# Patient Record
Sex: Female | Born: 1955
Health system: Southern US, Community
[De-identification: ages and names within clinical notes are randomized; demographics above are authoritative.]

## PROBLEM LIST (undated history)

## (undated) DIAGNOSIS — J439 Emphysema, unspecified: Secondary | ICD-10-CM

## (undated) DIAGNOSIS — F329 Major depressive disorder, single episode, unspecified: Secondary | ICD-10-CM

## (undated) DIAGNOSIS — M1990 Unspecified osteoarthritis, unspecified site: Secondary | ICD-10-CM

## (undated) DIAGNOSIS — F32A Depression, unspecified: Secondary | ICD-10-CM

## (undated) HISTORY — DX: Major depressive disorder, single episode, unspecified: F32.9

## (undated) HISTORY — DX: Depression, unspecified: F32.A

---

## 2004-08-28 ENCOUNTER — Ambulatory Visit: Payer: Self-pay | Admitting: Gynecology

## 2006-01-26 ENCOUNTER — Emergency Department: Payer: Self-pay | Admitting: Emergency Medicine

## 2006-01-26 ENCOUNTER — Other Ambulatory Visit: Payer: Self-pay

## 2006-01-28 ENCOUNTER — Ambulatory Visit: Payer: Self-pay | Admitting: Emergency Medicine

## 2007-03-06 ENCOUNTER — Ambulatory Visit: Payer: Self-pay

## 2008-12-06 ENCOUNTER — Ambulatory Visit: Payer: Self-pay | Admitting: Family Medicine

## 2008-12-08 ENCOUNTER — Ambulatory Visit: Payer: Self-pay | Admitting: Family Medicine

## 2008-12-22 ENCOUNTER — Ambulatory Visit: Payer: Self-pay | Admitting: Family Medicine

## 2008-12-31 ENCOUNTER — Ambulatory Visit: Payer: Self-pay | Admitting: Family Medicine

## 2009-02-23 ENCOUNTER — Ambulatory Visit: Payer: Self-pay | Admitting: Specialist

## 2009-07-27 ENCOUNTER — Ambulatory Visit: Payer: Self-pay | Admitting: Specialist

## 2009-08-17 ENCOUNTER — Ambulatory Visit: Payer: Self-pay | Admitting: Family Medicine

## 2009-12-08 ENCOUNTER — Ambulatory Visit: Payer: Self-pay | Admitting: Family Medicine

## 2010-11-06 ENCOUNTER — Emergency Department: Payer: Self-pay | Admitting: Internal Medicine

## 2010-11-08 ENCOUNTER — Ambulatory Visit: Payer: Self-pay | Admitting: Urology

## 2011-06-01 ENCOUNTER — Ambulatory Visit: Payer: Self-pay | Admitting: Family Medicine

## 2014-08-06 ENCOUNTER — Ambulatory Visit: Payer: Self-pay

## 2015-01-24 ENCOUNTER — Ambulatory Visit (INDEPENDENT_AMBULATORY_CARE_PROVIDER_SITE_OTHER): Payer: BLUE CROSS/BLUE SHIELD | Admitting: Family Medicine

## 2015-01-24 ENCOUNTER — Encounter: Payer: Self-pay | Admitting: Family Medicine

## 2015-01-24 VITALS — BP 120/80 | HR 80 | Ht 64.0 in | Wt 117.0 lb

## 2015-01-24 DIAGNOSIS — W57XXXA Bitten or stung by nonvenomous insect and other nonvenomous arthropods, initial encounter: Principal | ICD-10-CM

## 2015-01-24 DIAGNOSIS — L089 Local infection of the skin and subcutaneous tissue, unspecified: Secondary | ICD-10-CM

## 2015-01-24 DIAGNOSIS — S60469A Insect bite (nonvenomous) of unspecified finger, initial encounter: Secondary | ICD-10-CM

## 2015-01-24 MED ORDER — SULFAMETHOXAZOLE-TRIMETHOPRIM 400-80 MG PO TABS: 1.0000 | ORAL_TABLET | Freq: Two times a day (BID) | ORAL | Status: DC

## 2015-01-24 MED ORDER — DESOXIMETASONE 0.25 % EX CREA: 1.0000 "application " | TOPICAL_CREAM | Freq: Two times a day (BID) | CUTANEOUS | Status: DC

## 2015-01-24 MED ORDER — PREDNISONE 10 MG PO TABS: ORAL_TABLET | ORAL | Status: DC

## 2015-01-24 NOTE — Progress Notes (Signed)
Name: Rogelio Winbush   MRN: [id number]    DOB: [birth year]   Date:01/24/2015       Progress Note  Subjective  Chief Complaint  Chief Complaint  Patient presents with  . Animal Bite    bee sting- yellow jacket    Animal Bite  The incident occurred yesterday. The incident occurred at another residence. There is an injury to the right long finger. The pain is moderate. It is unlikely that a foreign body is present. Pertinent negatives include no chest pain, no fussiness, no numbness, no visual disturbance, no abdominal pain, no nausea, no bladder incontinence, no headaches, no hearing loss, no neck pain, no focal weakness, no tingling, no cough and no difficulty breathing. She is right-handed.    No problem-specific assessment & plan notes found for this encounter.   Past Medical History  Diagnosis Date  . Depression     History reviewed. No pertinent past surgical history.  No family history on file.  History   Social History  . Marital Status: Married    Spouse Name: N/A  . Number of Children: N/A  . Years of Education: N/A   Occupational History  . Not on file.   Social History Main Topics  . Smoking status: Current Every Day Smoker  . Smokeless tobacco: Not on file  . Alcohol Use: No  . Drug Use: No  . Sexual Activity: No   Other Topics Concern  . Not on file   Social History Narrative  . No narrative on file    Allergies  Allergen Reactions  . Penicillins      Review of Systems  Constitutional: Negative for fever, chills, weight loss and malaise/fatigue.  HENT: Negative for ear discharge, ear pain, hearing loss and sore throat.   Eyes: Negative for blurred vision and visual disturbance.  Respiratory: Negative for cough, sputum production, shortness of breath and wheezing.   Cardiovascular: Negative for chest pain, palpitations and leg swelling.  Gastrointestinal: Negative for heartburn, nausea, abdominal pain, diarrhea, constipation, blood in stool  and melena.  Genitourinary: Negative for bladder incontinence, dysuria, urgency, frequency and hematuria.  Musculoskeletal: Negative for myalgias, back pain, joint pain and neck pain.  Skin: Positive for itching and rash.  Neurological: Negative for dizziness, tingling, sensory change, focal weakness, numbness and headaches.  Endo/Heme/Allergies: Negative for environmental allergies and polydipsia. Does not bruise/bleed easily.  Psychiatric/Behavioral: Negative for depression and suicidal ideas. The patient is not nervous/anxious and does not have insomnia.      Objective  Filed Vitals:   01/24/15 1548  BP: 120/80  Pulse: 80  Height: 5\' 4"  (1.626 m)  Weight: 117 lb (53.071 kg)    Physical Exam  Constitutional: She is well-developed, well-nourished, and in no distress. No distress.  HENT:  Head: Normocephalic and atraumatic.  Right Ear: External ear normal.  Left Ear: External ear normal.  Nose: Nose normal.  Mouth/Throat: Oropharynx is clear and moist.  Eyes: Conjunctivae and EOM are normal. Pupils are equal, round, and reactive to light. Right eye exhibits no discharge. Left eye exhibits no discharge.  Neck: Normal range of motion. Neck supple. No JVD present. No thyromegaly present.  Cardiovascular: Normal rate, regular rhythm, normal heart sounds and intact distal pulses.  Exam reveals no gallop and no friction rub.   No murmur heard. Pulmonary/Chest: Effort normal and breath sounds normal. No respiratory distress. She has no wheezes. She has no rales. She exhibits no tenderness.  Abdominal: Soft. Bowel sounds are normal.  She exhibits no mass. There is no tenderness. There is no guarding.  Musculoskeletal: Normal range of motion. She exhibits no edema or tenderness.  Lymphadenopathy:    She has no cervical adenopathy.  Neurological: She is alert. She has normal reflexes.  Skin: Skin is warm and dry. Rash noted. She is not diaphoretic. There is erythema. No pallor.   Psychiatric: Mood and affect normal.      Assessment & Plan  Problem List Items Addressed This Visit    None    Visit Diagnoses    Insect bite finger-infected, initial encounter    -  Primary    Relevant Medications    sulfamethoxazole-trimethoprim (BACTRIM) 400-80 MG per tablet    predniSONE (DELTASONE) 10 MG tablet         Dr. Deanna Jones [facility]  01/24/2015

## 2015-10-11 ENCOUNTER — Encounter: Payer: Self-pay | Admitting: Family Medicine

## 2015-10-11 ENCOUNTER — Ambulatory Visit (INDEPENDENT_AMBULATORY_CARE_PROVIDER_SITE_OTHER): Payer: BLUE CROSS/BLUE SHIELD | Admitting: Family Medicine

## 2015-10-11 VITALS — BP 120/80 | HR 78 | Ht 64.0 in | Wt 112.0 lb

## 2015-10-11 DIAGNOSIS — J0100 Acute maxillary sinusitis, unspecified: Secondary | ICD-10-CM | POA: Diagnosis not present

## 2015-10-11 MED ORDER — AZITHROMYCIN 250 MG PO TABS: ORAL_TABLET | ORAL | Status: DC

## 2015-10-11 MED ORDER — BENZONATATE 100 MG PO CAPS: 100.0000 mg | ORAL_CAPSULE | Freq: Two times a day (BID) | ORAL | Status: DC | PRN

## 2015-10-11 NOTE — Progress Notes (Signed)
Name: Kathryn Willis   MRN: [id number]    DOB: 08/06/1955   Date:10/11/2015       Progress Note  Subjective  Chief Complaint  Chief Complaint  Patient presents with  . Sinusitis    cough and cong, sore throat    Sinusitis This is a new problem. The current episode started in the past 7 days. The problem has been gradually worsening since onset. There has been no fever. Associated symptoms include coughing, diaphoresis, headaches, sinus pressure and a sore throat. Pertinent negatives include no chills, congestion, ear pain, hoarse voice, neck pain or shortness of breath. Past treatments include acetaminophen. The treatment provided no relief.  Cough This is a new problem. The current episode started in the past 7 days. The cough is non-productive. Associated symptoms include headaches and a sore throat. Pertinent negatives include no chest pain, chills, ear pain, fever, heartburn, myalgias, rash, shortness of breath, weight loss or wheezing. There is no history of environmental allergies.    No problem-specific assessment & plan notes found for this encounter.   Past Medical History  Diagnosis Date  . Depression     No past surgical history on file.  No family history on file.  Social History   Social History  . Marital Status: Married    Spouse Name: N/A  . Number of Children: N/A  . Years of Education: N/A   Occupational History  . Not on file.   Social History Main Topics  . Smoking status: Current Every Day Smoker  . Smokeless tobacco: Not on file  . Alcohol Use: No  . Drug Use: No  . Sexual Activity: No   Other Topics Concern  . Not on file   Social History Narrative    Allergies  Allergen Reactions  . Penicillins      Review of Systems  Constitutional: Positive for diaphoresis. Negative for fever, chills, weight loss and malaise/fatigue.  HENT: Positive for sinus pressure and sore throat. Negative for congestion, ear discharge, ear pain and hoarse  voice.   Eyes: Negative for blurred vision.  Respiratory: Positive for cough. Negative for sputum production, shortness of breath and wheezing.   Cardiovascular: Negative for chest pain, palpitations and leg swelling.  Gastrointestinal: Negative for heartburn, nausea, abdominal pain, diarrhea, constipation, blood in stool and melena.  Genitourinary: Negative for dysuria, urgency, frequency and hematuria.  Musculoskeletal: Negative for myalgias, back pain, joint pain and neck pain.  Skin: Negative for rash.  Neurological: Positive for headaches. Negative for dizziness, tingling, sensory change and focal weakness.  Endo/Heme/Allergies: Negative for environmental allergies and polydipsia. Does not bruise/bleed easily.  Psychiatric/Behavioral: Negative for depression and suicidal ideas. The patient is not nervous/anxious and does not have insomnia.      Objective  Filed Vitals:   10/11/15 1403  BP: 120/80  Pulse: 78  Height: 5\' 4"  (1.626 m)  Weight: 112 lb (50.803 kg)    Physical Exam  Constitutional: She is well-developed, well-nourished, and in no distress. No distress.  HENT:  Head: Normocephalic and atraumatic.  Right Ear: External ear normal.  Left Ear: External ear normal.  Nose: Nose normal.  Mouth/Throat: Oropharynx is clear and moist.  Eyes: Conjunctivae and EOM are normal. Pupils are equal, round, and reactive to light. Right eye exhibits no discharge. Left eye exhibits no discharge.  Neck: Normal range of motion. Neck supple. No JVD present. No thyromegaly present.  Cardiovascular: Normal rate, regular rhythm, normal heart sounds and intact distal pulses.  Exam  reveals no gallop and no friction rub.   No murmur heard. Pulmonary/Chest: Effort normal and breath sounds normal.  Abdominal: Soft. Bowel sounds are normal. She exhibits no mass. There is no tenderness. There is no guarding.  Musculoskeletal: Normal range of motion. She exhibits no edema.  Lymphadenopathy:     She has no cervical adenopathy.  Neurological: She is alert. She has normal reflexes.  Skin: Skin is warm and dry. She is not diaphoretic.  Psychiatric: Mood and affect normal.  Nursing note and vitals reviewed.     Assessment & Plan  Problem List Items Addressed This Visit    None    Visit Diagnoses    Acute maxillary sinusitis, recurrence not specified    -  Primary    Relevant Medications    azithromycin (ZITHROMAX) 250 MG tablet    benzonatate (TESSALON) 100 MG capsule         Dr. Christien Berthelot [facility]  10/11/2015

## 2015-12-27 DIAGNOSIS — Z01419 Encounter for gynecological examination (general) (routine) without abnormal findings: Secondary | ICD-10-CM | POA: Diagnosis not present

## 2015-12-27 DIAGNOSIS — Z124 Encounter for screening for malignant neoplasm of cervix: Secondary | ICD-10-CM | POA: Diagnosis not present

## 2015-12-27 DIAGNOSIS — Z1231 Encounter for screening mammogram for malignant neoplasm of breast: Secondary | ICD-10-CM | POA: Diagnosis not present

## 2016-05-10 DIAGNOSIS — Z23 Encounter for immunization: Secondary | ICD-10-CM | POA: Diagnosis not present

## 2016-08-09 ENCOUNTER — Ambulatory Visit (INDEPENDENT_AMBULATORY_CARE_PROVIDER_SITE_OTHER): Payer: BLUE CROSS/BLUE SHIELD | Admitting: Family Medicine

## 2016-08-09 ENCOUNTER — Encounter: Payer: Self-pay | Admitting: Family Medicine

## 2016-08-09 VITALS — BP 108/68 | HR 92 | Temp 97.8°F | Ht 64.0 in | Wt 120.0 lb

## 2016-08-09 DIAGNOSIS — J0100 Acute maxillary sinusitis, unspecified: Secondary | ICD-10-CM

## 2016-08-09 MED ORDER — LEVOFLOXACIN 500 MG PO TABS: 500.0000 mg | ORAL_TABLET | Freq: Every day | ORAL | 0 refills | Status: DC

## 2016-08-09 NOTE — Progress Notes (Signed)
Name: Kathryn Willis   MRN: [id number]    DOB: [birth year]   Date:08/09/2016       Progress Note  Subjective  Chief Complaint  Chief Complaint  Patient presents with  . Cough    Pt stated coughing for 1 month.    Cough  This is a new problem. The current episode started in the past 7 days. The problem has been waxing and waning. The problem occurs hourly. The cough is non-productive. Associated symptoms include chest pain, headaches, nasal congestion and a sore throat. Pertinent negatives include no chills, ear congestion, ear pain, fever, heartburn, hemoptysis, myalgias, postnasal drip, rash, shortness of breath, weight loss or wheezing. The symptoms are aggravated by cold air. She has tried nothing for the symptoms. The treatment provided mild relief. Her past medical history is significant for COPD. There is no history of environmental allergies or pneumonia.    No problem-specific Assessment & Plan notes found for this encounter.   Past Medical History:  Diagnosis Date  . Depression     No past surgical history on file.  No family history on file.  Social History   Social History  . Marital status: Married    Spouse name: N/A  . Number of children: N/A  . Years of education: N/A   Occupational History  . Not on file.   Social History Main Topics  . Smoking status: Current Every Day Smoker  . Smokeless tobacco: Not on file  . Alcohol use No  . Drug use: No  . Sexual activity: No   Other Topics Concern  . Not on file   Social History Narrative  . No narrative on file    Allergies  Allergen Reactions  . Penicillins      Review of Systems  Constitutional: Negative for chills, fever, malaise/fatigue and weight loss.  HENT: Positive for sore throat. Negative for ear discharge, ear pain and postnasal drip.   Eyes: Negative for blurred vision.  Respiratory: Positive for cough. Negative for hemoptysis, sputum production, shortness of breath and wheezing.    Cardiovascular: Positive for chest pain. Negative for palpitations and leg swelling.  Gastrointestinal: Negative for abdominal pain, blood in stool, constipation, diarrhea, heartburn, melena and nausea.  Genitourinary: Negative for dysuria, frequency, hematuria and urgency.  Musculoskeletal: Negative for back pain, joint pain, myalgias and neck pain.  Skin: Negative for rash.  Neurological: Positive for headaches. Negative for dizziness, tingling, sensory change and focal weakness.  Endo/Heme/Allergies: Negative for environmental allergies and polydipsia. Does not bruise/bleed easily.  Psychiatric/Behavioral: Negative for depression and suicidal ideas. The patient is not nervous/anxious and does not have insomnia.      Objective  Vitals:   08/09/16 1526  BP: 108/68  Pulse: 92  Temp: 97.8 F (36.6 C)  SpO2: 96%  Weight: 120 lb (54.4 kg)  Height: 5\' 4"  (1.626 m)    Physical Exam  Constitutional: She is well-developed, well-nourished, and in no distress. No distress.  HENT:  Head: Normocephalic and atraumatic.  Right Ear: External ear normal.  Left Ear: External ear normal.  Nose: Nose normal.  Mouth/Throat: Oropharynx is clear and moist.  Eyes: Conjunctivae and EOM are normal. Pupils are equal, round, and reactive to light. Right eye exhibits no discharge. Left eye exhibits no discharge.  Neck: Normal range of motion. Neck supple. No JVD present. No thyromegaly present.  Cardiovascular: Normal rate, regular rhythm, normal heart sounds and intact distal pulses.  Exam reveals no gallop and no friction rub.  No murmur heard. Pulmonary/Chest: Effort normal and breath sounds normal. She has no wheezes. She has no rales.  Abdominal: Soft. Bowel sounds are normal. She exhibits no mass. There is no tenderness. There is no guarding.  Musculoskeletal: Normal range of motion. She exhibits no edema.  Lymphadenopathy:    She has no cervical adenopathy.  Neurological: She is alert. She  has normal reflexes.  Skin: Skin is warm and dry. She is not diaphoretic.  Psychiatric: Mood and affect normal.  Nursing note and vitals reviewed.     Assessment & Plan  Problem List Items Addressed This Visit    None    Visit Diagnoses    Acute maxillary sinusitis, recurrence not specified    -  Primary   Relevant Medications   levofloxacin (LEVAQUIN) 500 MG tablet        Dr. Macon [facility]  08/09/16

## 2016-09-06 DIAGNOSIS — Z1231 Encounter for screening mammogram for malignant neoplasm of breast: Secondary | ICD-10-CM | POA: Diagnosis not present

## 2016-12-27 DIAGNOSIS — Z01419 Encounter for gynecological examination (general) (routine) without abnormal findings: Secondary | ICD-10-CM | POA: Diagnosis not present

## 2017-05-17 DIAGNOSIS — Z23 Encounter for immunization: Secondary | ICD-10-CM | POA: Diagnosis not present

## 2017-07-26 ENCOUNTER — Encounter: Payer: Self-pay | Admitting: Family Medicine

## 2017-07-26 ENCOUNTER — Ambulatory Visit (INDEPENDENT_AMBULATORY_CARE_PROVIDER_SITE_OTHER): Payer: BLUE CROSS/BLUE SHIELD | Admitting: Family Medicine

## 2017-07-26 ENCOUNTER — Ambulatory Visit
Admission: RE | Admit: 2017-07-26 | Discharge: 2017-07-26 | Disposition: A | Discharge status: Home / Self Care | Payer: BLUE CROSS/BLUE SHIELD | Source: Ambulatory Visit | Attending: Family Medicine | Admitting: Family Medicine

## 2017-07-26 VITALS — BP 100/60 | HR 84 | Temp 100.0°F | Ht 64.0 in | Wt 128.0 lb

## 2017-07-26 DIAGNOSIS — R059 Cough, unspecified: Secondary | ICD-10-CM

## 2017-07-26 DIAGNOSIS — R05 Cough: Secondary | ICD-10-CM | POA: Insufficient documentation

## 2017-07-26 DIAGNOSIS — R509 Fever, unspecified: Secondary | ICD-10-CM | POA: Insufficient documentation

## 2017-07-26 LAB — POCT INFLUENZA A/B
INFLUENZA A, POC: NEGATIVE
Influenza B, POC: NEGATIVE

## 2017-07-26 MED ORDER — GUAIFENESIN-CODEINE 100-10 MG/5ML PO SYRP: 5.0000 mL | ORAL_SOLUTION | Freq: Three times a day (TID) | ORAL | 0 refills | Status: DC | PRN

## 2017-07-26 MED ORDER — LEVOFLOXACIN 500 MG PO TABS: 500.0000 mg | ORAL_TABLET | Freq: Every day | ORAL | 0 refills | Status: DC

## 2017-07-26 NOTE — Progress Notes (Signed)
Name: Kathryn Willis   MRN: [id number]    DOB: 1956-06-22   Date:07/26/2017       Progress Note  Subjective  Chief Complaint  Chief Complaint  Patient presents with  . Sinusitis    cough and cong- no production, body aches, sweating, chills, no fever    Sinusitis  This is a new problem. The current episode started in the past 7 days. The problem has been gradually worsening since onset. The maximum temperature recorded prior to her arrival was 100.4 - 100.9 F. The fever has been present for 1 to 2 days. Associated symptoms include chills, congestion, coughing, diaphoresis, ear pain, headaches, a hoarse voice, shortness of breath and a sore throat. Pertinent negatives include no neck pain or sinus pressure. Past treatments include acetaminophen. The treatment provided no relief.  Cough  This is a new problem. The current episode started in the past 7 days. The problem has been waxing and waning. The cough is non-productive. Associated symptoms include chills, ear pain, headaches, a sore throat and shortness of breath. Pertinent negatives include no chest pain, fever, heartburn, hemoptysis, myalgias, rash, rhinorrhea, weight loss or wheezing. There is no history of environmental allergies.    No problem-specific Assessment & Plan notes found for this encounter.   Past Medical History:  Diagnosis Date  . Depression     History reviewed. No pertinent surgical history.  History reviewed. No pertinent family history.  Social History   Socioeconomic History  . Marital status: Married    Spouse name: Not on file  . Number of children: Not on file  . Years of education: Not on file  . Highest education level: Not on file  Social Needs  . Financial resource strain: Not on file  . Food insecurity - worry: Not on file  . Food insecurity - inability: Not on file  . Transportation needs - medical: Not on file  . Transportation needs - non-medical: Not on file  Occupational History   . Not on file  Tobacco Use  . Smoking status: Current Every Day Smoker  . Smokeless tobacco: Never Used  Substance and Sexual Activity  . Alcohol use: No    Alcohol/week: 0.0 oz  . Drug use: No  . Sexual activity: No  Other Topics Concern  . Not on file  Social History Narrative  . Not on file    Allergies  Allergen Reactions  . Penicillins     Outpatient Medications Prior to Visit  Medication Sig Dispense Refill  . sertraline (ZOLOFT) 50 MG tablet Take 1.5 tablets by mouth daily.  3  . levofloxacin (LEVAQUIN) 500 MG tablet Take 1 tablet (500 mg total) by mouth daily. 7 tablet 0   No facility-administered medications prior to visit.     Review of Systems  Constitutional: Positive for chills and diaphoresis. Negative for fever, malaise/fatigue and weight loss.  HENT: Positive for congestion, ear pain, hoarse voice and sore throat. Negative for ear discharge, rhinorrhea and sinus pressure.   Eyes: Negative for blurred vision.  Respiratory: Positive for cough and shortness of breath. Negative for hemoptysis, sputum production and wheezing.   Cardiovascular: Negative for chest pain, palpitations and leg swelling.  Gastrointestinal: Negative for abdominal pain, blood in stool, constipation, diarrhea, heartburn, melena and nausea.  Genitourinary: Negative for dysuria, frequency, hematuria and urgency.  Musculoskeletal: Negative for back pain, joint pain, myalgias and neck pain.  Skin: Negative for rash.  Neurological: Positive for headaches. Negative for dizziness, tingling, sensory  change and focal weakness.  Endo/Heme/Allergies: Negative for environmental allergies and polydipsia. Does not bruise/bleed easily.  Psychiatric/Behavioral: Negative for depression and suicidal ideas. The patient is not nervous/anxious and does not have insomnia.      Objective  Vitals:   07/26/17 1332  BP: 100/60  Pulse: 84  Temp: 100 F (37.8 C)  TempSrc: Oral  SpO2: 96%  Weight: 128 lb  (58.1 kg)  Height: 5\' 4"  (1.626 m)    Physical Exam  Constitutional: She is well-developed, well-nourished, and in no distress. No distress.  HENT:  Head: Normocephalic and atraumatic.  Right Ear: External ear normal.  Left Ear: External ear normal.  Nose: Nose normal.  Mouth/Throat: Oropharynx is clear and moist.  Eyes: Conjunctivae and EOM are normal. Pupils are equal, round, and reactive to light. Right eye exhibits no discharge. Left eye exhibits no discharge.  Neck: Normal range of motion. Neck supple. No JVD present. No thyromegaly present.  Cardiovascular: Normal rate, regular rhythm, normal heart sounds and intact distal pulses. Exam reveals no gallop and no friction rub.  No murmur heard. Pulmonary/Chest: Effort normal and breath sounds normal. She has no wheezes. She has no rales.  Abdominal: Soft. Bowel sounds are normal. She exhibits no distension and no mass. There is no tenderness. There is no rebound and no guarding.  Musculoskeletal: Normal range of motion. She exhibits no edema.  Lymphadenopathy:    She has no cervical adenopathy.  Neurological: She is alert. She has normal reflexes.  Skin: Skin is warm and dry. She is not diaphoretic.  Psychiatric: Mood and affect normal.  Nursing note and vitals reviewed.     Assessment & Plan  Problem List Items Addressed This Visit    None    Visit Diagnoses    Fever, unspecified fever cause    -  Primary   Relevant Orders   POCT Influenza A/B   Cough       Relevant Medications   levofloxacin (LEVAQUIN) 500 MG tablet   guaiFENesin-codeine (ROBITUSSIN AC) 100-10 MG/5ML syrup   Other Relevant Orders   POCT Influenza A/B   DG Chest 2 View (Completed)      Meds ordered this encounter  Medications  . levofloxacin (LEVAQUIN) 500 MG tablet    Sig: Take 1 tablet (500 mg total) by mouth daily.    Dispense:  7 tablet    Refill:  0  . guaiFENesin-codeine (ROBITUSSIN AC) 100-10 MG/5ML syrup    Sig: Take 5 mLs by mouth 3  (three) times daily as needed for cough.    Dispense:  150 mL    Refill:  0      Dr. Otilio Miu [facility]  07/26/17

## 2017-08-28 ENCOUNTER — Encounter: Payer: Self-pay | Admitting: Family Medicine

## 2017-08-28 ENCOUNTER — Ambulatory Visit (INDEPENDENT_AMBULATORY_CARE_PROVIDER_SITE_OTHER): Payer: BLUE CROSS/BLUE SHIELD | Admitting: Family Medicine

## 2017-08-28 VITALS — BP 124/82 | HR 84 | Temp 98.7°F | Resp 16 | Ht 64.0 in | Wt 128.0 lb

## 2017-08-28 DIAGNOSIS — M940 Chondrocostal junction syndrome [Tietze]: Secondary | ICD-10-CM | POA: Diagnosis not present

## 2017-08-28 DIAGNOSIS — F17210 Nicotine dependence, cigarettes, uncomplicated: Secondary | ICD-10-CM

## 2017-08-28 DIAGNOSIS — J4521 Mild intermittent asthma with (acute) exacerbation: Secondary | ICD-10-CM | POA: Diagnosis not present

## 2017-08-28 DIAGNOSIS — J219 Acute bronchiolitis, unspecified: Secondary | ICD-10-CM | POA: Diagnosis not present

## 2017-08-28 MED ORDER — ALBUTEROL SULFATE HFA 108 (90 BASE) MCG/ACT IN AERS: 2.0000 | INHALATION_SPRAY | Freq: Four times a day (QID) | RESPIRATORY_TRACT | 2 refills | Status: DC | PRN

## 2017-08-28 MED ORDER — IPRATROPIUM-ALBUTEROL 0.5-2.5 (3) MG/3ML IN SOLN
3.0000 mL | Freq: Once | RESPIRATORY_TRACT | Status: AC
  Administered 2017-08-28: 3 mL via RESPIRATORY_TRACT

## 2017-08-28 MED ORDER — GUAIFENESIN-CODEINE 100-10 MG/5ML PO SYRP: 5.0000 mL | ORAL_SOLUTION | Freq: Three times a day (TID) | ORAL | 0 refills | Status: DC | PRN

## 2017-08-28 MED ORDER — DOXYCYCLINE HYCLATE 100 MG PO TABS: 100.0000 mg | ORAL_TABLET | Freq: Two times a day (BID) | ORAL | 0 refills | Status: DC

## 2017-08-28 NOTE — Progress Notes (Signed)
Name: Kathryn Willis   MRN: [id number]    DOB: August 29, 1955   Date:08/28/2017       Progress Note  Subjective  Chief Complaint  Chief Complaint  Patient presents with  . Cough    has had since December. Wants another Abx and said she did [facility] appt and they gave her tesslon pearls but did not help.   Marland Kitchen Shortness of Breath    chest feels heavy     Cough  This is a recurrent (previous covered with tessalon perles) problem. The current episode started 1 to 4 weeks ago. The problem has been waxing and waning. The problem occurs every few minutes. The cough is productive of sputum and productive of purulent sputum (yellow). Associated symptoms include ear pain, a fever, headaches, nasal congestion, postnasal drip, rhinorrhea, a sore throat, shortness of breath, sweats and wheezing. Pertinent negatives include no chest pain, chills, ear congestion, heartburn, hemoptysis, myalgias, rash or weight loss. The symptoms are aggravated by cold air (supine). Treatments tried: tessalon perles. The treatment provided mild relief. There is no history of environmental allergies.  Shortness of Breath  Associated symptoms include ear pain, a fever, headaches, rhinorrhea, a sore throat and wheezing. Pertinent negatives include no abdominal pain, chest pain, hemoptysis, leg swelling, neck pain, rash or sputum production.    No problem-specific Assessment & Plan notes found for this encounter.   Past Medical History:  Diagnosis Date  . Depression     History reviewed. No pertinent surgical history.  History reviewed. No pertinent family history.  Social History   Socioeconomic History  . Marital status: Married    Spouse name: Not on file  . Number of children: Not on file  . Years of education: Not on file  . Highest education level: Not on file  Social Needs  . Financial resource strain: Not on file  . Food insecurity - worry: Not on file  . Food insecurity - inability: Not on file  .  Transportation needs - medical: Not on file  . Transportation needs - non-medical: Not on file  Occupational History  . Not on file  Tobacco Use  . Smoking status: Current Every Day Smoker  . Smokeless tobacco: Never Used  Substance and Sexual Activity  . Alcohol use: No    Alcohol/week: 0.0 oz  . Drug use: No  . Sexual activity: No  Other Topics Concern  . Not on file  Social History Narrative  . Not on file    Allergies  Allergen Reactions  . Penicillins     Outpatient Medications Prior to Visit  Medication Sig Dispense Refill  . sertraline (ZOLOFT) 50 MG tablet Take 1.5 tablets by mouth daily.  3  . guaiFENesin-codeine (ROBITUSSIN AC) 100-10 MG/5ML syrup Take 5 mLs by mouth 3 (three) times daily as needed for cough. 150 mL 0  . levofloxacin (LEVAQUIN) 500 MG tablet Take 1 tablet (500 mg total) by mouth daily. 7 tablet 0   No facility-administered medications prior to visit.     Review of Systems  Constitutional: Positive for fever. Negative for chills, malaise/fatigue and weight loss.  HENT: Positive for ear pain, postnasal drip, rhinorrhea and sore throat. Negative for ear discharge.   Eyes: Negative for blurred vision.  Respiratory: Positive for cough, shortness of breath and wheezing. Negative for hemoptysis and sputum production.   Cardiovascular: Negative for chest pain, palpitations and leg swelling.  Gastrointestinal: Negative for abdominal pain, blood in stool, constipation, diarrhea, heartburn, melena and  nausea.  Genitourinary: Negative for dysuria, frequency, hematuria and urgency.  Musculoskeletal: Negative for back pain, joint pain, myalgias and neck pain.  Skin: Negative for rash.  Neurological: Positive for headaches. Negative for dizziness, tingling, sensory change and focal weakness.  Endo/Heme/Allergies: Negative for environmental allergies and polydipsia. Does not bruise/bleed easily.  Psychiatric/Behavioral: Negative for depression and suicidal  ideas. The patient is not nervous/anxious and does not have insomnia.      Objective  Vitals:   08/28/17 0918  BP: 124/82  Pulse: 84  Resp: 16  Temp: 98.7 F (37.1 C)  TempSrc: Oral  SpO2: 98%  Weight: 128 lb (58.1 kg)  Height: 5\' 4"  (1.626 m)    Physical Exam  Constitutional: She is well-developed, well-nourished, and in no distress. No distress.  HENT:  Head: Normocephalic and atraumatic.  Right Ear: Tympanic membrane, external ear and ear canal normal.  Left Ear: Tympanic membrane, external ear and ear canal normal.  Nose: Mucosal edema present.  Mouth/Throat: Oropharynx is clear and moist. No oropharyngeal exudate, posterior oropharyngeal edema or posterior oropharyngeal erythema.  Eyes: Conjunctivae and EOM are normal. Pupils are equal, round, and reactive to light. Right eye exhibits no discharge. Left eye exhibits no discharge.  Neck: Normal range of motion. Neck supple. No JVD present. No thyromegaly present.  Cardiovascular: Normal rate, regular rhythm, normal heart sounds and intact distal pulses. Exam reveals no gallop and no friction rub.  No murmur heard. Pulmonary/Chest: Effort normal. She has wheezes. She has no rales. She exhibits bony tenderness.  Abdominal: Soft. Bowel sounds are normal. She exhibits no mass. There is no tenderness. There is no guarding.  Musculoskeletal: Normal range of motion. She exhibits no edema.  Lymphadenopathy:       Head (right side): No submandibular adenopathy present.       Head (left side): No submandibular adenopathy present.    She has no cervical adenopathy.  Neurological: She is alert. She has normal reflexes.  Skin: Skin is warm and dry. She is not diaphoretic.  Psychiatric: Mood and affect normal.  Nursing note and vitals reviewed.     Assessment & Plan  Problem List Items Addressed This Visit    None    Visit Diagnoses    Bronchiolitis    -  Primary   Relevant Medications   doxycycline (VIBRA-TABS) 100 MG  tablet   guaiFENesin-codeine (ROBITUSSIN AC) 100-10 MG/5ML syrup   Mild intermittent reactive airway disease with acute exacerbation       Relevant Medications   albuterol (PROVENTIL HFA;VENTOLIN HFA) 108 (90 Base) MCG/ACT inhaler   guaiFENesin-codeine (ROBITUSSIN AC) 100-10 MG/5ML syrup   ipratropium-albuterol (DUONEB) 0.5-2.5 (3) MG/3ML nebulizer solution 3 mL (Completed)   Cigarette nicotine dependence without complication       Costochondritis          Meds ordered this encounter  Medications  . doxycycline (VIBRA-TABS) 100 MG tablet    Sig: Take 1 tablet (100 mg total) by mouth 2 (two) times daily.    Dispense:  20 tablet    Refill:  0  . albuterol (PROVENTIL HFA;VENTOLIN HFA) 108 (90 Base) MCG/ACT inhaler    Sig: Inhale 2 puffs into the lungs every 6 (six) hours as needed for wheezing or shortness of breath.    Dispense:  1 Inhaler    Refill:  2  . guaiFENesin-codeine (ROBITUSSIN AC) 100-10 MG/5ML syrup    Sig: Take 5 mLs by mouth 3 (three) times daily as needed for cough.  Dispense:  150 mL    Refill:  0  . ipratropium-albuterol (DUONEB) 0.5-2.5 (3) MG/3ML nebulizer solution 3 mL  Patient has been advised of the health risks of smoking and counseled concerning cessation of tobacco products. I spent over 3 minutes for discussion and to answer questions.    Dr. [facility]  08/28/17

## 2017-08-28 NOTE — Patient Instructions (Addendum)
How to Use a Metered Dose Inhaler A metered dose inhaler is a handheld device for taking medicine that must be breathed into the lungs (inhaled). The device can be used to deliver a variety of inhaled medicines, including:  Quick relief or rescue medicines, such as bronchodilators.  Controller medicines, such as corticosteroids.  The medicine is delivered by pushing down on a metal canister to release a preset amount of spray and medicine. Each device contains the amount of medicine that is needed for a preset number of uses (inhalations). Your health care provider may recommend that you use a spacer with your inhaler to help you take the medicine more effectively. A spacer is a plastic tube with a mouthpiece on one end and an opening that connects to the inhaler on the other end. A spacer holds the medicine in a tube for a short time, which allows you to inhale more medicine. What are the risks? If you do not use your inhaler correctly, medicine might not reach your lungs to help you breathe. Inhaler medicine can cause side effects, such as:  Mouth or throat infection.  Cough.  Hoarseness.  Headache.  Nausea and vomiting.  Lung infection (pneumonia) in people who have a lung condition called COPD.  How to use a metered dose inhaler without a spacer 1. Remove the cap from the inhaler. 2. If you are using the inhaler for the first time, shake it for 5 seconds, turn it away from your face, then release 4 puffs into the air. This is called priming. 3. Shake the inhaler for 5 seconds. 4. Position the inhaler so the top of the canister faces up. 5. Put your index finger on the top of the medicine canister. Support the bottom of the inhaler with your thumb. 6. Breathe out normally and as completely as possible, away from the inhaler. 7. Either place the inhaler between your teeth and close your lips tightly around the mouthpiece, or hold the inhaler 1-2 inches (2.5-5 cm) away from your open  mouth. Keep your tongue down out of the way. If you are unsure which technique to use, ask your health care provider. 8. Press the canister down with your index finger to release the medicine, then inhale deeply and slowly through your mouth (not your nose) until your lungs are completely filled. Inhaling should take 4-6 seconds. 9. Hold the medicine in your lungs for 5-10 seconds (10 seconds is best). This helps the medicine get into the small airways of your lungs. 10. With your lips in a tight circle (pursed), breathe out slowly. 11. Repeat steps 3-10 until you have taken the number of puffs that your health care provider directed. Wait about 1 minute between puffs or as directed. 12. Put the cap on the inhaler. 13. If you are using a steroid inhaler, rinse your mouth with water, gargle, and spit out the water. Do not swallow the water. How to use a metered dose inhaler with a spacer 1. Remove the cap from the inhaler. 2. If you are using the inhaler for the first time, shake it for 5 seconds, turn it away from your face, then release 4 puffs into the air. This is called priming. 3. Shake the inhaler for 5 seconds. 4. Place the open end of the spacer onto the inhaler mouthpiece. 5. Position the inhaler so the top of the canister faces up and the spacer mouthpiece faces you. 6. Put your index finger on the top of the medicine canister.  Support the bottom of the inhaler and the spacer with your thumb. 7. Breathe out normally and as completely as possible, away from the spacer. 8. Place the spacer between your teeth and close your lips tightly around it. Keep your tongue down out of the way. 9. Press the canister down with your index finger to release the medicine, then inhale deeply and slowly through your mouth (not your nose) until your lungs are completely filled. Inhaling should take 4-6 seconds. 10. Hold the medicine in your lungs for 5-10 seconds (10 seconds is best). This helps the medicine  get into the small airways of your lungs. 11. With your lips in a tight circle (pursed), breathe out slowly. 12. Repeat steps 3-11 until you have taken the number of puffs that your health care provider directed. Wait about 1 minute between puffs or as directed. 13. Remove the spacer from the inhaler and put the cap on the inhaler. 14. If you are using a steroid inhaler, rinse your mouth with water, gargle, and spit out the water. Do not swallow the water. Follow these instructions at home:  Take your inhaled medicine only as told by your health care provider. Do not use the inhaler more than directed by your health care provider.  Keep all follow-up visits as told by your health care provider. This is important.  If your inhaler has a counter, you can check it to determine how full your inhaler is. If your inhaler does not have a counter, ask your health care provider when you will need to refill your inhaler and write the refill date on a calendar or on your inhaler canister. Note that you cannot know when an inhaler is empty by shaking it.  Follow directions on the package insert for care and cleaning of your inhaler and spacer. Contact a health care provider if:  Symptoms are only partially relieved with your inhaler.  You are having trouble using your inhaler.  You have an increase in phlegm.  You have headaches. Get help right away if:  You feel little or no relief after using your inhaler.  You have dizziness.  You have a fast heart rate.  You have chills or a fever.  You have night sweats.  There is blood in your phlegm. Summary  A metered dose inhaler is a handheld device for taking medicine that must be breathed into the lungs (inhaled).  The medicine is delivered by pushing down on a metal canister to release a preset amount of spray and medicine.  Each device contains the amount of medicine that is needed for a preset number of uses (inhalations). This  information is not intended to replace advice given to you by your health care provider. Make sure you discuss any questions you have with your health care provider. Document Released: 07/16/2005 Document Revised: 06/05/2016 Document Reviewed: 06/05/2016 Elsevier Interactive Patient Education  2017 [employer].  Steps to Quit Smoking Smoking tobacco can be harmful to your health and can affect almost every organ in your body. Smoking puts you, and those around you, at risk for developing many serious chronic diseases. Quitting smoking is difficult, but it is one of the best things that you can do for your health. It is never too late to quit. What are the benefits of quitting smoking? When you quit smoking, you lower your risk of developing serious diseases and conditions, such as:  Lung cancer or lung disease, such as COPD.  Heart disease.  Stroke.  Heart attack.  Infertility.  Osteoporosis and bone fractures.  Additionally, symptoms such as coughing, wheezing, and shortness of breath may get better when you quit. You may also find that you get sick less often because your body is stronger at fighting off colds and infections. If you are pregnant, quitting smoking can help to reduce your chances of having a baby of low birth weight. How do I get ready to quit? When you decide to quit smoking, create a plan to make sure that you are successful. Before you quit:  Pick a date to quit. Set a date within the next two weeks to give you time to prepare.  Write down the reasons why you are quitting. Keep this list in places where you will see it often, such as on your bathroom mirror or in your car or wallet.  Identify the people, places, things, and activities that make you want to smoke (triggers) and avoid them. Make sure to take these actions: ? Throw away all cigarettes at home, at work, and in your car. ? Throw away smoking accessories, such as Scientist, research (medical). ? Clean your car  and make sure to empty the ashtray. ? Clean your home, including curtains and carpets.  Tell your family, friends, and coworkers that you are quitting. Support from your loved ones can make quitting easier.  Talk with your health care provider about your options for quitting smoking.  Find out what treatment options are covered by your health insurance.  What strategies can I use to quit smoking? Talk with your healthcare provider about different strategies to quit smoking. Some strategies include:  Quitting smoking altogether instead of gradually lessening how much you smoke over a period of time. Research shows that quitting "cold [place]" is more successful than gradually quitting.  Attending in-person counseling to help you build problem-solving skills. You are more likely to have success in quitting if you attend several counseling sessions. Even short sessions of 10 minutes can be effective.  Finding resources and support systems that can help you to quit smoking and remain smoke-free after you quit. These resources are most helpful when you use them often. They can include: ? Online chats with a Social worker. ? Telephone quitlines. ? Careers information officer. ? Support groups or group counseling. ? Text messaging programs. ? Mobile phone applications.  Taking medicines to help you quit smoking. (If you are pregnant or breastfeeding, talk with your health care provider first.) Some medicines contain nicotine and some do not. Both types of medicines help with cravings, but the medicines that include nicotine help to relieve withdrawal symptoms. Your health care provider may recommend: ? Nicotine patches, gum, or lozenges. ? Nicotine inhalers or sprays. ? Non-nicotine medicine that is taken by mouth.  Talk with your health care provider about combining strategies, such as taking medicines while you are also receiving in-person counseling. Using these two strategies together makes you  more likely to succeed in quitting than if you used either strategy on its own. If you are pregnant or breastfeeding, talk with your health care provider about finding counseling or other support strategies to quit smoking. Do not take medicine to help you quit smoking unless told to do so by your health care provider. What things can I do to make it easier to quit? Quitting smoking might feel overwhelming at first, but there is a lot that you can do to make it easier. Take these important actions:  Reach out to your family  and friends and ask that they support and encourage you during this time. Call telephone quitlines, reach out to support groups, or work with a counselor for support.  Ask people who smoke to avoid smoking around you.  Avoid places that trigger you to smoke, such as bars, parties, or smoke-break areas at work.  Spend time around people who do not smoke.  Lessen stress in your life, because stress can be a smoking trigger for some people. To lessen stress, try: ? Exercising regularly. ? Deep-breathing exercises. ? Yoga. ? Meditating. ? Performing a body scan. This involves closing your eyes, scanning your body from head to toe, and noticing which parts of your body are particularly tense. Purposefully relax the muscles in those areas.  Download or purchase mobile phone or tablet apps (applications) that can help you stick to your quit plan by providing reminders, tips, and encouragement. There are many free apps, such as QuitGuide from the [employer] Office manager for Disease Control and Prevention). You can find other support for quitting smoking (smoking cessation) through smokefree.gov and other websites.  How will I feel when I quit smoking? Within the first 24 hours of quitting smoking, you may start to feel some withdrawal symptoms. These symptoms are usually most noticeable 2-3 days after quitting, but they usually do not last beyond 2-3 weeks. Changes or symptoms that you might  experience include:  Mood swings.  Restlessness, anxiety, or irritation.  Difficulty concentrating.  Dizziness.  Strong cravings for sugary foods in addition to nicotine.  Mild weight gain.  Constipation.  Nausea.  Coughing or a sore throat.  Changes in how your medicines work in your body.  A depressed mood.  Difficulty sleeping (insomnia).  After the first 2-3 weeks of quitting, you may start to notice more positive results, such as:  Improved sense of smell and taste.  Decreased coughing and sore throat.  Slower heart rate.  Lower blood pressure.  Clearer skin.  The ability to breathe more easily.  Fewer sick days.  Quitting smoking is very challenging for most people. Do not get discouraged if you are not successful the first time. Some people need to make many attempts to quit before they achieve long-term success. Do your best to stick to your quit plan, and talk with your health care provider if you have any questions or concerns. This information is not intended to replace advice given to you by your health care provider. Make sure you discuss any questions you have with your health care provider. Document Released: 07/10/2001 Document Revised: 03/13/2016 Document Reviewed: 11/30/2014 Elsevier Interactive Patient Education  [employer].

## 2017-12-13 DIAGNOSIS — D485 Neoplasm of uncertain behavior of skin: Secondary | ICD-10-CM | POA: Diagnosis not present

## 2017-12-13 DIAGNOSIS — D1800 Hemangioma unspecified site: Secondary | ICD-10-CM | POA: Diagnosis not present

## 2017-12-13 DIAGNOSIS — L578 Other skin changes due to chronic exposure to nonionizing radiation: Secondary | ICD-10-CM | POA: Diagnosis not present

## 2017-12-13 DIAGNOSIS — Z86018 Personal history of other benign neoplasm: Secondary | ICD-10-CM | POA: Diagnosis not present

## 2017-12-13 DIAGNOSIS — L821 Other seborrheic keratosis: Secondary | ICD-10-CM | POA: Diagnosis not present

## 2017-12-19 DIAGNOSIS — S01502A Unspecified open wound of oral cavity, initial encounter: Secondary | ICD-10-CM | POA: Diagnosis not present

## 2017-12-19 HISTORY — PX: MOUTH SURGERY: SHX715

## 2018-01-15 ENCOUNTER — Other Ambulatory Visit: Payer: Self-pay | Admitting: Obstetrics and Gynecology

## 2018-01-15 DIAGNOSIS — Z1231 Encounter for screening mammogram for malignant neoplasm of breast: Secondary | ICD-10-CM

## 2018-01-21 ENCOUNTER — Ambulatory Visit
Admission: RE | Admit: 2018-01-21 | Discharge: 2018-01-21 | Disposition: A | Discharge status: Home / Self Care | Payer: BLUE CROSS/BLUE SHIELD | Source: Ambulatory Visit | Attending: Obstetrics and Gynecology | Admitting: Obstetrics and Gynecology

## 2018-01-21 ENCOUNTER — Encounter (INDEPENDENT_AMBULATORY_CARE_PROVIDER_SITE_OTHER): Payer: Self-pay

## 2018-01-21 DIAGNOSIS — Z1231 Encounter for screening mammogram for malignant neoplasm of breast: Secondary | ICD-10-CM | POA: Diagnosis not present

## 2018-01-27 ENCOUNTER — Inpatient Hospital Stay
Admission: RE | Admit: 2018-01-27 | Discharge: 2018-01-27 | Disposition: A | Discharge status: Home / Self Care | Payer: Self-pay | Source: Ambulatory Visit | Attending: *Deleted | Admitting: *Deleted

## 2018-01-27 ENCOUNTER — Other Ambulatory Visit: Payer: Self-pay | Admitting: *Deleted

## 2018-01-27 DIAGNOSIS — Z9289 Personal history of other medical treatment: Secondary | ICD-10-CM

## 2018-02-27 DIAGNOSIS — Z01419 Encounter for gynecological examination (general) (routine) without abnormal findings: Secondary | ICD-10-CM | POA: Diagnosis not present

## 2018-02-27 DIAGNOSIS — Z124 Encounter for screening for malignant neoplasm of cervix: Secondary | ICD-10-CM | POA: Diagnosis not present

## 2018-06-11 DIAGNOSIS — L578 Other skin changes due to chronic exposure to nonionizing radiation: Secondary | ICD-10-CM | POA: Diagnosis not present

## 2018-06-11 DIAGNOSIS — L910 Hypertrophic scar: Secondary | ICD-10-CM | POA: Diagnosis not present

## 2018-06-11 DIAGNOSIS — L821 Other seborrheic keratosis: Secondary | ICD-10-CM | POA: Diagnosis not present

## 2018-06-11 DIAGNOSIS — Z1283 Encounter for screening for malignant neoplasm of skin: Secondary | ICD-10-CM | POA: Diagnosis not present

## 2018-06-23 ENCOUNTER — Encounter: Payer: Self-pay | Admitting: Family Medicine

## 2018-06-23 ENCOUNTER — Ambulatory Visit (INDEPENDENT_AMBULATORY_CARE_PROVIDER_SITE_OTHER): Payer: BLUE CROSS/BLUE SHIELD | Admitting: Family Medicine

## 2018-06-23 VITALS — BP 120/70 | HR 80 | Ht 64.0 in | Wt 139.0 lb

## 2018-06-23 DIAGNOSIS — J0100 Acute maxillary sinusitis, unspecified: Secondary | ICD-10-CM

## 2018-06-23 DIAGNOSIS — Z23 Encounter for immunization: Secondary | ICD-10-CM | POA: Diagnosis not present

## 2018-06-23 MED ORDER — GUAIFENESIN-CODEINE 100-10 MG/5ML PO SYRP: 5.0000 mL | ORAL_SOLUTION | Freq: Three times a day (TID) | ORAL | 0 refills | Status: DC | PRN

## 2018-06-23 MED ORDER — AZITHROMYCIN 250 MG PO TABS: ORAL_TABLET | ORAL | 0 refills | Status: DC

## 2018-06-23 NOTE — Progress Notes (Signed)
Date:  06/23/2018   Name:  Kathryn Willis   DOB:  01/29/1956   MRN:  [id number]   Chief Complaint: Sinusitis (cough without production, headache, no drainage) and tdap vaccination Sinusitis  This is a chronic problem. The current episode started more than 1 year ago. The problem has been waxing and waning since onset. There has been no fever. Associated symptoms include congestion, ear pain, headaches, neck pain, sneezing and a sore throat. Pertinent negatives include no chills, coughing, diaphoresis, hoarse voice, shortness of breath, sinus pressure or swollen glands. Past treatments include oral decongestants (delsym). The treatment provided no relief.     Review of Systems  Constitutional: Negative.  Negative for chills, diaphoresis, fatigue, fever and unexpected weight change.  HENT: Positive for congestion, ear pain, sneezing and sore throat. Negative for ear discharge, hoarse voice, rhinorrhea and sinus pressure.   Eyes: Negative for photophobia, pain, discharge, redness and itching.  Respiratory: Negative for cough, shortness of breath, wheezing and stridor.   Gastrointestinal: Negative for abdominal pain, blood in stool, constipation, diarrhea, nausea and vomiting.  Endocrine: Negative for cold intolerance, heat intolerance, polydipsia, polyphagia and polyuria.  Genitourinary: Negative for dysuria, flank pain, frequency, hematuria, menstrual problem, pelvic pain, urgency, vaginal bleeding and vaginal discharge.  Musculoskeletal: Positive for neck pain. Negative for arthralgias, back pain and myalgias.  Skin: Negative for rash.  Allergic/Immunologic: Negative for environmental allergies and food allergies.  Neurological: Positive for headaches. Negative for dizziness, weakness, light-headedness and numbness.  Hematological: Negative for adenopathy. Does not bruise/bleed easily.  Psychiatric/Behavioral: Negative for dysphoric mood. The patient is not nervous/anxious.      There are no active problems to display for this patient.   Allergies  Allergen Reactions  . Penicillins     No past surgical history on file.  Social History   Tobacco Use  . Smoking status: Former Smoker    Types: Cigarettes    Last attempt to quit: 12/19/2017    Years since quitting: 0.5  . Smokeless tobacco: Never Used  Substance Use Topics  . Alcohol use: No    Alcohol/week: 0.0 standard drinks  . Drug use: No     Medication list has been reviewed and updated.  Current Meds  Medication Sig  . albuterol (PROVENTIL HFA;VENTOLIN HFA) 108 (90 Base) MCG/ACT inhaler Inhale 2 puffs into the lungs every 6 (six) hours as needed for wheezing or shortness of breath.  . sertraline (ZOLOFT) 50 MG tablet Take 1.5 tablets by mouth daily.    No flowsheet data found.  Physical Exam  Constitutional: She is oriented to person, place, and time. She appears well-developed and well-nourished.  HENT:  Head: Normocephalic.  Right Ear: External ear normal.  Left Ear: External ear normal.  Mouth/Throat: Oropharynx is clear and moist.  Eyes: Pupils are equal, round, and reactive to light. Conjunctivae and EOM are normal. Lids are everted and swept, no foreign bodies found. Left eye exhibits no hordeolum. No foreign body present in the left eye. Right conjunctiva is not injected. Left conjunctiva is not injected. No scleral icterus.  Neck: Normal range of motion. Neck supple. No JVD present. No tracheal deviation present. No thyromegaly present.  Cardiovascular: Normal rate, regular rhythm, normal heart sounds and intact distal pulses. Exam reveals no gallop and no friction rub.  No murmur heard. Pulmonary/Chest: Effort normal and breath sounds normal. No respiratory distress. She has no wheezes. She has no rales.  Abdominal: Soft. Bowel sounds are normal. She exhibits no  mass. There is no hepatosplenomegaly. There is no tenderness. There is no rebound and no guarding.  Musculoskeletal:  Normal range of motion. She exhibits no edema or tenderness.  Lymphadenopathy:    She has no cervical adenopathy.  Neurological: She is alert and oriented to person, place, and time. She has normal strength. She displays normal reflexes. No cranial nerve deficit.  Skin: Skin is warm. No rash noted.  Psychiatric: She has a normal mood and affect. Her mood appears not anxious. She does not exhibit a depressed mood.  Nursing note and vitals reviewed.   BP 120/70   Pulse 80   Ht 5\' 4"  (1.626 m)   Wt 139 lb (63 kg)   BMI 23.86 kg/m   Assessment and Plan:  1. Acute maxillary sinusitis, recurrence not specified Start ZPack and [facility] for cough as needed - azithromycin (ZITHROMAX) 250 MG tablet; 2 today then 1 a day for four days.  Dispense: 6 tablet; Refill: 0 - guaiFENesin-codeine (ROBITUSSIN AC) 100-10 MG/5ML syrup; Take 5 mLs by mouth 3 (three) times daily as needed for cough.  Dispense: 100 mL; Refill: 0  2. Need for diphtheria-tetanus-pertussis (Tdap) vaccine Discussed and administered - Tdap vaccine greater than or equal to 7yo IM   Dr. [facility]  06/23/2018

## 2018-07-07 ENCOUNTER — Telehealth: Payer: Self-pay

## 2018-07-07 NOTE — Telephone Encounter (Signed)
Received record request from [employer] to release info to [employer] policy. 5 yrs of records requested. Called pt to verify that she wanted these sent out and had requested them. She said, "Send them, go ahead." Records will be sent

## 2018-10-13 ENCOUNTER — Other Ambulatory Visit: Payer: Self-pay | Admitting: Family Medicine

## 2018-10-13 DIAGNOSIS — J4521 Mild intermittent asthma with (acute) exacerbation: Secondary | ICD-10-CM

## 2019-01-16 ENCOUNTER — Other Ambulatory Visit: Payer: Self-pay

## 2019-01-16 ENCOUNTER — Encounter: Payer: Self-pay | Admitting: Family Medicine

## 2019-01-16 ENCOUNTER — Ambulatory Visit (INDEPENDENT_AMBULATORY_CARE_PROVIDER_SITE_OTHER): Payer: BC Managed Care – PPO | Admitting: Family Medicine

## 2019-01-16 ENCOUNTER — Telehealth: Payer: Self-pay | Admitting: *Deleted

## 2019-01-16 VITALS — BP 120/72 | HR 80 | Ht 64.0 in | Wt 139.0 lb

## 2019-01-16 DIAGNOSIS — Z122 Encounter for screening for malignant neoplasm of respiratory organs: Secondary | ICD-10-CM

## 2019-01-16 DIAGNOSIS — Z1211 Encounter for screening for malignant neoplasm of colon: Secondary | ICD-10-CM

## 2019-01-16 DIAGNOSIS — Z87891 Personal history of nicotine dependence: Secondary | ICD-10-CM

## 2019-01-16 NOTE — Telephone Encounter (Signed)
Received referral for initial lung cancer screening scan. Contacted patient and obtained smoking history,(former, quit 12/19/17, 34 pack year) as well as answering questions related to screening process. Patient denies signs of lung cancer such as weight loss or hemoptysis. Patient denies comorbidity that would prevent curative treatment if lung cancer were found. Patient is scheduled for shared decision making visit and CT scan on 01/27/19 at 115pm.

## 2019-01-16 NOTE — Patient Instructions (Signed)
Colonoscopy, Adult A colonoscopy is an exam to look at the entire large intestine. During the exam, a lubricated, flexible tube that has a camera on the end of it is inserted into the anus and then passed into the rectum, colon, and other parts of the large intestine. You may have a colonoscopy as a part of normal colorectal screening or if you have certain symptoms, such as:  Lack of red blood cells (anemia).  Diarrhea that does not go away.  Abdominal pain.  Blood in your stool (feces). A colonoscopy can help screen for and diagnose medical problems, including:  Tumors.  Polyps.  Inflammation.  Areas of bleeding. Tell a health care provider about:  Any allergies you have.  All medicines you are taking, including vitamins, herbs, eye drops, creams, and over-the-counter medicines.  Any problems you or family members have had with anesthetic medicines.  Any blood disorders you have.  Any surgeries you have had.  Any medical conditions you have.  Any problems you have had passing stool. What are the risks? Generally, this is a safe procedure. However, problems may occur, including:  Bleeding.  A tear in the intestine.  A reaction to medicines given during the exam.  Infection (rare). What happens before the procedure? Eating and drinking restrictions Follow instructions from your health care provider about eating and drinking, which may include:  A few days before the procedure - follow a low-fiber diet. Avoid nuts, seeds, dried fruit, raw fruits, and vegetables.  1-3 days before the procedure - follow a clear liquid diet. Drink only clear liquids, such as clear broth or bouillon, black coffee or tea, clear juice, clear soft drinks or sports drinks, gelatin dessert, and popsicles. Avoid any liquids that contain red or purple dye.  On the day of the procedure - do not eat or drink anything starting 2 hours before the procedure, or within the time period that your  health care provider recommends. Up to 2 hours before the procedure, you may continue to drink clear liquids, such as water or clear fruit juice. Bowel prep If you were prescribed an oral bowel prep to clean out your colon:  Take it as told by your health care provider. Starting the day before your procedure, you will need to drink a large amount of medicated liquid. The liquid will cause you to have multiple loose stools until your stool is almost clear or light green.  If your skin or anus gets irritated from diarrhea, you may use these to relieve the irritation: ? Medicated wipes, such as adult wet wipes with aloe and vitamin E. ? A skin-soothing product like petroleum jelly.  If you vomit while drinking the bowel prep, take a break for up to 60 minutes and then begin the bowel prep again. If vomiting continues and you cannot take the bowel prep without vomiting, call your health care provider.  To clean out your colon, you may also be given: ? Laxative medicines. ? Instructions about how to use an enema. General instructions  Ask your health care provider about: ? Changing or stopping your regular medicines or supplements. This is especially important if you are taking iron supplements, diabetes medicines, or blood thinners. ? Taking medicines such as aspirin and ibuprofen. These medicines can thin your blood. Do not take these medicines before the procedure if your health care provider tells you not to.  Plan to have someone take you home from the hospital or clinic. What happens during the procedure?     An IV may be inserted into one of your veins.  You will be given medicine to help you relax (sedative).  To reduce your risk of infection: ? Your health care team will wash or sanitize their hands. ? Your anal area will be washed with soap.  You will be asked to lie on your side with your knees bent.  Your health care provider will lubricate a long, thin, flexible tube. The  tube will have a camera and a light on the end.  The tube will be inserted into your anus.  The tube will be gently eased through your rectum and colon.  Air will be delivered into your colon to keep it open. You may feel some pressure or cramping.  The camera will be used to take images during the procedure.  A small tissue sample may be removed to be examined under a microscope (biopsy).  If small polyps are found, your health care provider may remove them and have them checked for cancer cells.  When the exam is done, the tube will be removed. The procedure may vary among health care providers and hospitals. What happens after the procedure?  Your blood pressure, heart rate, breathing rate, and blood oxygen level will be monitored until the medicines you were given have worn off.  Do not drive for 24 hours after the exam.  You may have a small amount of blood in your stool.  You may pass gas and have mild abdominal cramping or bloating due to the air that was used to inflate your colon during the exam.  It is up to you to get the results of your procedure. Ask your health care provider, or the department performing the procedure, when your results will be ready. Summary  A colonoscopy is an exam to look at the entire large intestine.  During a colonoscopy, a lubricated, flexible tube with a camera on the end of it is inserted into the anus and then passed into the colon and other parts of the large intestine.  Follow instructions from your health care provider about eating and drinking before the procedure.  If you were prescribed an oral bowel prep to clean out your colon, take it as told by your health care provider.  After your procedure, your blood pressure, heart rate, breathing rate, and blood oxygen level will be monitored until the medicines you were given have worn off. This information is not intended to replace advice given to you by your health care provider. Make  sure you discuss any questions you have with your health care provider. Document Released: 07/13/2000 Document Revised: 05/08/2017 Document Reviewed: 09/27/2015 Elsevier Interactive Patient Education  2019 Elsevier Inc.  

## 2019-01-16 NOTE — Telephone Encounter (Signed)
Received referral for low dose lung cancer screening CT scan. Message left at phone number listed in EMR for patient to call me back to facilitate scheduling scan.  

## 2019-01-16 NOTE — Progress Notes (Signed)
Date:  01/16/2019   Name:  Kathryn Willis   DOB:  31-May-1956   MRN:  [id number]   Chief Complaint: referral colonscopy (no issues- never had one)  GI Problem Primary symptoms do not include fever, weight loss, fatigue, abdominal pain, nausea, vomiting, diarrhea, melena, hematemesis, jaundice, hematochezia, dysuria, myalgias, arthralgias or rash.  The illness does not include chills, anorexia, dysphagia, odynophagia, bloating, constipation, tenesmus, back pain or itching. Associated medical issues do not include GERD, liver disease, alcohol abuse or hemorrhoids.    Review of Systems  Constitutional: Negative.  Negative for chills, fatigue, fever, unexpected weight change and weight loss.  HENT: Negative for congestion, drooling, ear discharge, ear pain, rhinorrhea, sinus pressure, sneezing and sore throat.   Eyes: Negative for photophobia, pain, discharge, redness and itching.  Respiratory: Negative for cough, shortness of breath, wheezing and stridor.   Cardiovascular: Negative for chest pain, palpitations and leg swelling.  Gastrointestinal: Negative for abdominal pain, anorexia, bloating, blood in stool, constipation, diarrhea, dysphagia, hematemesis, hematochezia, jaundice, melena, nausea and vomiting.  Endocrine: Negative for cold intolerance, heat intolerance, polydipsia, polyphagia and polyuria.  Genitourinary: Negative for dysuria, flank pain, frequency, hematuria, menstrual problem, pelvic pain, urgency, vaginal bleeding and vaginal discharge.  Musculoskeletal: Negative for arthralgias, back pain, myalgias and neck pain.  Skin: Negative for itching and rash.  Allergic/Immunologic: Negative for environmental allergies and food allergies.  Neurological: Negative for dizziness, weakness, light-headedness, numbness and headaches.  Hematological: Negative for adenopathy. Does not bruise/bleed easily.  Psychiatric/Behavioral: Negative for dysphoric mood and suicidal ideas. The  patient is not nervous/anxious.     There are no active problems to display for this patient.   Allergies  Allergen Reactions  . Penicillins     No past surgical history on file.  Social History   Tobacco Use  . Smoking status: Former Smoker    Types: Cigarettes    Quit date: 12/19/2017    Years since quitting: 1.0  . Smokeless tobacco: Never Used  Substance Use Topics  . Alcohol use: No    Alcohol/week: 0.0 standard drinks  . Drug use: No     Medication list has been reviewed and updated.  Current Meds  Medication Sig  . sertraline (ZOLOFT) 50 MG tablet Take 1.5 tablets by mouth daily.    PHQ 2/9 Scores 01/16/2019  PHQ - 2 Score 5  PHQ- 9 Score 11    BP Readings from Last 3 Encounters:  01/16/19 120/72  06/23/18 120/70  08/28/17 124/82    Physical Exam Vitals signs and nursing note reviewed.  Constitutional:      General: She is not in acute distress.    Appearance: She is not diaphoretic.  HENT:     Head: Normocephalic and atraumatic.     Jaw: There is normal jaw occlusion.     Right Ear: Tympanic membrane, ear canal and external ear normal.     Left Ear: Tympanic membrane, ear canal and external ear normal.     Nose: Nose normal.     Mouth/Throat:     Lips: Pink.     Pharynx: Oropharynx is clear. Uvula midline.  Eyes:     General:        Right eye: No discharge.        Left eye: No discharge.     Conjunctiva/sclera: Conjunctivae normal.     Pupils: Pupils are equal, round, and reactive to light.  Neck:     Musculoskeletal: Normal range of motion and  neck supple.     Thyroid: No thyroid mass, thyromegaly or thyroid tenderness.     Vascular: No JVD.  Cardiovascular:     Rate and Rhythm: Normal rate and regular rhythm.     Chest Wall: PMI is not displaced.     Pulses: Normal pulses.     Heart sounds: Normal heart sounds, S1 normal and S2 normal. No murmur. No systolic murmur. No diastolic murmur. No friction rub. No gallop. No S3 or S4 sounds.    Pulmonary:     Effort: Pulmonary effort is normal.     Breath sounds: Normal breath sounds.  Abdominal:     General: Bowel sounds are normal.     Palpations: Abdomen is soft. There is no hepatomegaly, splenomegaly or mass.     Tenderness: There is no abdominal tenderness. There is no guarding.  Genitourinary:    Rectum: Normal. Guaiac result negative. No mass or tenderness.  Musculoskeletal: Normal range of motion.  Lymphadenopathy:     Cervical: No cervical adenopathy.     Right cervical: No superficial cervical adenopathy.    Left cervical: No superficial cervical adenopathy.  Skin:    General: Skin is warm and dry.  Neurological:     Mental Status: She is alert.     Deep Tendon Reflexes: Reflexes are normal and symmetric.     Wt Readings from Last 3 Encounters:  01/16/19 139 lb (63 kg)  06/23/18 139 lb (63 kg)  08/28/17 128 lb (58.1 kg)    BP 120/72   Pulse 80   Ht 5\' 4"  (1.626 m)   Wt 139 lb (63 kg)   BMI 23.86 kg/m   Assessment and Plan: 1. Screen for colon cancer Discussed screening for colon cancer with patient.  Patient is still in the process of grieving from the loss of her sister due to a rare form of appendiceal cancer described as a signet ring cancer.  Discussed with patient that the only way I would know of detecting is that you know she needs to be protected proactive having a anoscopy.  Referral was placed with gastroenterology.  Rectal exam today was done and guaiac was noted to be negative. - Ambulatory referral to Gastroenterology

## 2019-01-26 ENCOUNTER — Encounter: Payer: Self-pay | Admitting: Nurse Practitioner

## 2019-01-27 ENCOUNTER — Inpatient Hospital Stay: Payer: BC Managed Care – PPO | Attending: Nurse Practitioner | Admitting: Nurse Practitioner

## 2019-01-27 ENCOUNTER — Other Ambulatory Visit: Payer: Self-pay

## 2019-01-27 ENCOUNTER — Ambulatory Visit
Admission: RE | Admit: 2019-01-27 | Discharge: 2019-01-27 | Disposition: A | Discharge status: Home / Self Care | Payer: BC Managed Care – PPO | Source: Ambulatory Visit | Attending: Nurse Practitioner | Admitting: Nurse Practitioner

## 2019-01-27 DIAGNOSIS — Z87891 Personal history of nicotine dependence: Secondary | ICD-10-CM | POA: Diagnosis not present

## 2019-01-27 DIAGNOSIS — Z1379 Encounter for other screening for genetic and chromosomal anomalies: Secondary | ICD-10-CM

## 2019-01-27 DIAGNOSIS — Z122 Encounter for screening for malignant neoplasm of respiratory organs: Secondary | ICD-10-CM | POA: Diagnosis not present

## 2019-01-27 NOTE — Progress Notes (Addendum)
Virtual Visit via Video Enabled Telemedicine Note   I connected with Kathryn Willis on 01/27/19 EST at 1:15 PM EST by video enabled telemedicine visit and verified that I am speaking with the correct person using two identifiers.   I discussed the limitations, risks, security and privacy concerns of performing an evaluation and management service by telemedicine and the availability of in-person appointments. I also discussed with the patient that there may be a patient responsible charge related to this service. The patient expressed understanding and agreed to proceed.   Other persons participating in the visit and their role in the encounter: Burgess Estelle, RN  Patient's location: clinic  Provider's location: home  Chief Complaint: Low Dose CT Screening  Patient agreed to evaluation by telephone/telemedicine to discuss shared decision making for consideration of low dose CT lung cancer screening.    In accordance with CMS guidelines, patient has met eligibility criteria including age, absence of signs or symptoms of lung cancer.  Social History   Tobacco Use  . Smoking status: Former Smoker    Packs/day: 1.00    Years: 34.00    Pack years: 34.00    Types: Cigarettes    Quit date: 12/19/2017    Years since quitting: 1.1  . Smokeless tobacco: Never Used  Substance Use Topics  . Alcohol use: No    Alcohol/week: 0.0 standard drinks     A shared decision-making session was conducted prior to the performance of CT scan. This includes one or more decision aids, includes benefits and harms of screening, follow-up diagnostic testing, over-diagnosis, false positive rate, and total radiation exposure.   Counseling on the importance of adherence to annual lung cancer LDCT screening, impact of co-morbidities, and ability or willingness to undergo diagnosis and treatment is imperative for compliance of the program.   Counseling on the importance of continued smoking cessation for former  smokers; the importance of smoking cessation for current smokers, and information about tobacco cessation interventions have been given to patient including [place] and [facility] quit [place] programs.   Written order for lung cancer screening with LDCT has been given to the patient and any and all questions have been answered to the best of my abilities.    Yearly follow up will be coordinated by Burgess Estelle, Thoracic Navigator.  I discussed the assessment and treatment plan with the patient. The patient was provided an opportunity to ask questions and all were answered. The patient agreed with the plan and demonstrated an understanding of the instructions.   The patient was advised to call back or seek an in-person evaluation if the symptoms worsen or if the condition fails to improve as anticipated.   I provided 15 minutes of face-to-face video visit time during this encounter, and > 50% was spent counseling as documented under my assessment & plan.   Beckey Rutter, DNP, AGNP-C [facility] [phone] (work cell) [phone] (office)

## 2019-01-28 ENCOUNTER — Telehealth: Payer: Self-pay | Admitting: *Deleted

## 2019-01-28 NOTE — Telephone Encounter (Signed)
Notified patient of LDCT lung cancer screening program results with recommendation for 12 month follow up imaging. Also notified of incidental findings noted below and is encouraged to discuss further with PCP who will receive a copy of this note and/or the CT report. Patient verbalizes understanding.   IMPRESSION: 1. Lung-RADS 2, benign appearance or behavior. Continue annual screening with low-dose chest CT without contrast in 12 months. 2. Aortic atherosclerosis (ICD10-I70.0) and emphysema (ICD10-J43.9).

## 2019-02-03 ENCOUNTER — Telehealth: Payer: Self-pay | Admitting: Gastroenterology

## 2019-02-03 NOTE — Telephone Encounter (Signed)
Patient returned Kathryn Willis's call.

## 2019-02-03 NOTE — Telephone Encounter (Signed)
Patient called & l/m to schedule a colonoscopy. She in in the referral work-q.

## 2019-02-09 ENCOUNTER — Other Ambulatory Visit: Payer: Self-pay

## 2019-02-09 ENCOUNTER — Encounter: Payer: Self-pay | Admitting: *Deleted

## 2019-02-09 DIAGNOSIS — Z800 Family history of malignant neoplasm of digestive organs: Secondary | ICD-10-CM

## 2019-02-09 DIAGNOSIS — Z1211 Encounter for screening for malignant neoplasm of colon: Secondary | ICD-10-CM

## 2019-02-09 MED ORDER — NA SULFATE-K SULFATE-MG SULF 17.5-3.13-1.6 GM/177ML PO SOLN: 1.0000 | Freq: Once | ORAL | 0 refills | Status: AC

## 2019-02-09 NOTE — Telephone Encounter (Signed)
Gastroenterology Pre-Procedure Review  Request Date: 02/16/19 Requesting Physician: Dr. Allen Norris  PATIENT REVIEW QUESTIONS: The patient responded to the following health history questions as indicated:    1. Are you having any GI issues? no 2. Do you have a personal history of Polyps? no 3. Do you have a family history of Colon Cancer or Polyps? yes (Sister now deceased was diagnosed with adenocarcinoma of the appendix, and colon cancer) 4. Diabetes Mellitus? no 5. Joint replacements in the past 12 months?no 6. Major health problems in the past 3 months?no 7. Any artificial heart valves, MVP, or defibrillator?no    MEDICATIONS & ALLERGIES:    Patient reports the following regarding taking any anticoagulation/antiplatelet therapy:   Plavix, Coumadin, Eliquis, Xarelto, Lovenox, Pradaxa, Brilinta, or Effient? no Aspirin? no  Patient confirms/reports the following medications:  Current Outpatient Medications  Medication Sig Dispense Refill  . Na Sulfate-K Sulfate-Mg Sulf 17.5-3.13-1.6 GM/177ML SOLN Take 1 kit by mouth once for 1 dose. 354 mL 0  . sertraline (ZOLOFT) 50 MG tablet Take 1.5 tablets by mouth daily.  3  . VENTOLIN HFA 108 (90 Base) MCG/ACT inhaler INHALE 2 PUFFS INTO THE LUNGS EVERY 6 HOURS AS NEEDED FOR WHEEZING OR SHORTNESS OF BREATH (Patient not taking: Reported on 01/16/2019) 18 g 0   No current facility-administered medications for this visit.     Patient confirms/reports the following allergies:  Allergies  Allergen Reactions  . Penicillins     No orders of the defined types were placed in this encounter.   AUTHORIZATION INFORMATION Primary Insurance: 1D#: Group #:  Secondary Insurance: 1D#: Group #:  SCHEDULE INFORMATION: Date: 02/16/19 Time: Location:[facility]

## 2019-02-12 ENCOUNTER — Other Ambulatory Visit: Payer: Self-pay

## 2019-02-12 ENCOUNTER — Other Ambulatory Visit
Admission: RE | Admit: 2019-02-12 | Discharge: 2019-02-12 | Disposition: A | Discharge status: Home / Self Care | Payer: BC Managed Care – PPO | Source: Ambulatory Visit | Attending: Gastroenterology | Admitting: Gastroenterology

## 2019-02-12 ENCOUNTER — Other Ambulatory Visit: Payer: Self-pay | Admitting: Obstetrics and Gynecology

## 2019-02-12 DIAGNOSIS — Z1159 Encounter for screening for other viral diseases: Secondary | ICD-10-CM | POA: Diagnosis not present

## 2019-02-12 DIAGNOSIS — Z1231 Encounter for screening mammogram for malignant neoplasm of breast: Secondary | ICD-10-CM

## 2019-02-12 LAB — SARS CORONAVIRUS 2 (TAT 6-24 HRS): SARS Coronavirus 2: NEGATIVE

## 2019-02-12 NOTE — Discharge Instructions (Signed)

## 2019-02-16 ENCOUNTER — Ambulatory Visit: Payer: BC Managed Care – PPO | Admitting: Anesthesiology

## 2019-02-16 ENCOUNTER — Other Ambulatory Visit: Payer: Self-pay

## 2019-02-16 ENCOUNTER — Ambulatory Visit
Admission: RE | Admit: 2019-02-16 | Discharge: 2019-02-16 | Disposition: A | Discharge status: Home / Self Care | Payer: BC Managed Care – PPO | Attending: Gastroenterology | Admitting: Gastroenterology

## 2019-02-16 ENCOUNTER — Encounter
Admission: RE | Disposition: A | Discharge status: Home / Self Care | Payer: Self-pay | Source: Home / Self Care | Attending: Gastroenterology

## 2019-02-16 DIAGNOSIS — Z86010 Personal history of colonic polyps: Secondary | ICD-10-CM | POA: Diagnosis not present

## 2019-02-16 DIAGNOSIS — J439 Emphysema, unspecified: Secondary | ICD-10-CM | POA: Insufficient documentation

## 2019-02-16 DIAGNOSIS — Z800 Family history of malignant neoplasm of digestive organs: Secondary | ICD-10-CM | POA: Diagnosis not present

## 2019-02-16 DIAGNOSIS — K621 Rectal polyp: Secondary | ICD-10-CM | POA: Diagnosis not present

## 2019-02-16 DIAGNOSIS — K635 Polyp of colon: Secondary | ICD-10-CM | POA: Insufficient documentation

## 2019-02-16 DIAGNOSIS — D123 Benign neoplasm of transverse colon: Secondary | ICD-10-CM | POA: Diagnosis not present

## 2019-02-16 DIAGNOSIS — Z87891 Personal history of nicotine dependence: Secondary | ICD-10-CM | POA: Diagnosis not present

## 2019-02-16 DIAGNOSIS — Z1211 Encounter for screening for malignant neoplasm of colon: Secondary | ICD-10-CM | POA: Insufficient documentation

## 2019-02-16 DIAGNOSIS — F329 Major depressive disorder, single episode, unspecified: Secondary | ICD-10-CM | POA: Diagnosis not present

## 2019-02-16 DIAGNOSIS — K641 Second degree hemorrhoids: Secondary | ICD-10-CM | POA: Insufficient documentation

## 2019-02-16 DIAGNOSIS — D122 Benign neoplasm of ascending colon: Secondary | ICD-10-CM

## 2019-02-16 DIAGNOSIS — D125 Benign neoplasm of sigmoid colon: Secondary | ICD-10-CM | POA: Diagnosis not present

## 2019-02-16 HISTORY — DX: Emphysema, unspecified: J43.9

## 2019-02-16 HISTORY — PX: POLYPECTOMY: SHX5525

## 2019-02-16 HISTORY — PX: COLONOSCOPY WITH PROPOFOL: SHX5780

## 2019-02-16 HISTORY — DX: Unspecified osteoarthritis, unspecified site: M19.90

## 2019-02-16 SURGERY — COLONOSCOPY WITH PROPOFOL
Anesthesia: General | Site: Rectum

## 2019-02-16 MED ORDER — LIDOCAINE HCL (CARDIAC) PF 100 MG/5ML IV SOSY
PREFILLED_SYRINGE | INTRAVENOUS | Status: DC | PRN
  Administered 2019-02-16: 50 mg via INTRAVENOUS

## 2019-02-16 MED ORDER — LACTATED RINGERS IV SOLN
INTRAVENOUS | Status: DC
  Administered 2019-02-16: 10:00:00 via INTRAVENOUS

## 2019-02-16 MED ORDER — STERILE WATER FOR IRRIGATION IR SOLN
Status: DC | PRN
  Administered 2019-02-16: 15 mL

## 2019-02-16 MED ORDER — SODIUM CHLORIDE 0.9 % IV SOLN: INTRAVENOUS | Status: DC

## 2019-02-16 MED ORDER — PROPOFOL 500 MG/50ML IV EMUL
INTRAVENOUS | Status: DC | PRN
  Administered 2019-02-16 (×2): 50 mg via INTRAVENOUS
  Administered 2019-02-16: 100 mg via INTRAVENOUS

## 2019-02-16 SURGICAL SUPPLY — 10 items
CANISTER SUCT 1200ML W/VALVE (MISCELLANEOUS) ×2 IMPLANT
ELECT REM PT RETURN 9FT ADLT (ELECTROSURGICAL) ×2
ELECTRODE REM PT RTRN 9FT ADLT (ELECTROSURGICAL) IMPLANT
FORCEPS BIOP RAD 4 LRG CAP 4 (CUTTING FORCEPS) ×1 IMPLANT
GOWN CVR UNV OPN BCK APRN NK (MISCELLANEOUS) ×2 IMPLANT
GOWN ISOL THUMB LOOP REG UNIV (MISCELLANEOUS) ×2
KIT ENDO PROCEDURE OLY (KITS) ×2 IMPLANT
SNARE SHORT THROW 13M SML OVAL (MISCELLANEOUS) ×1 IMPLANT
TRAP ETRAP POLY (MISCELLANEOUS) ×1 IMPLANT
WATER STERILE IRR 250ML POUR (IV SOLUTION) ×2 IMPLANT

## 2019-02-16 NOTE — Anesthesia Preprocedure Evaluation (Signed)
Anesthesia Evaluation  Patient identified by MRN, date of birth, ID band Patient awake    History of Anesthesia Complications Negative for: history of anesthetic complications  Airway Mallampati: I  TM Distance: >3 FB Neck ROM: Full    Dental no notable dental hx.    Pulmonary COPD, former smoker,    Pulmonary exam normal        Cardiovascular Exercise Tolerance: Good negative cardio ROS Normal cardiovascular exam     Neuro/Psych negative neurological ROS     GI/Hepatic negative GI ROS, Neg liver ROS,   Endo/Other  negative endocrine ROS  Renal/GU negative Renal ROS     Musculoskeletal negative musculoskeletal ROS (+)   Abdominal   Peds  Hematology negative hematology ROS (+)   Anesthesia Other Findings   Reproductive/Obstetrics                             Anesthesia Physical Anesthesia Plan  ASA: II  Anesthesia Plan: General   Post-op Pain Management:    Induction: Intravenous  PONV Risk Score and Plan:   Airway Management Planned: Natural Airway and Nasal Cannula  Additional Equipment: None  Intra-op Plan:   Post-operative Plan:   Informed Consent: I have reviewed the patients History and Physical, chart, labs and discussed the procedure including the risks, benefits and alternatives for the proposed anesthesia with the patient or authorized representative who has indicated his/her understanding and acceptance.       Plan Discussed with:   Anesthesia Plan Comments:         Anesthesia Quick Evaluation

## 2019-02-16 NOTE — H&P (Signed)
Lucilla Lame, MD [address] [place], [place] [address] Phone:[phone] Fax : [phone]  Primary Care Physician:  Juline Patch, MD Primary Gastroenterologist:  Dr. Allen Norris  Pre-Procedure History & Physical: HPI:  Kathryn Willis is a 63 y.o. female is here for an colonoscopy.   Past Medical History:  Diagnosis Date  . Arthritis    finger  . Depression   . Emphysema of lung [facility])     Past Surgical History:  Procedure Laterality Date  . MOUTH SURGERY  12/19/2017   Bone graft    Prior to Admission medications   Medication Sig Start Date End Date Taking? Authorizing Provider  sertraline (ZOLOFT) 50 MG tablet Take 1.5 tablets by mouth daily. 01/05/15  Yes [provider]  VENTOLIN HFA 108 (90 Base) MCG/ACT inhaler INHALE 2 PUFFS INTO THE LUNGS EVERY 6 HOURS AS NEEDED FOR WHEEZING OR SHORTNESS OF BREATH 10/13/18  Yes Juline Patch, MD    Allergies as of 02/09/2019 - Review Complete 02/09/2019  Allergen Reaction Noted  . Penicillins Anaphylaxis 01/24/2015    Family History  Problem Relation Age of Onset  . Breast cancer Mother 11  . Breast cancer Cousin 76       mat cousin    Social History   Socioeconomic History  . Marital status: Married    Spouse name: Not on file  . Number of children: Not on file  . Years of education: Not on file  . Highest education level: Not on file  Occupational History  . Not on file  Social Needs  . Financial resource strain: Not on file  . Food insecurity    Worry: Not on file    Inability: Not on file  . Transportation needs    Medical: Not on file    Non-medical: Not on file  Tobacco Use  . Smoking status: Former Smoker    Packs/day: 1.00    Years: 34.00    Pack years: 34.00    Types: Cigarettes    Quit date: 12/19/2017    Years since quitting: 1.1  . Smokeless tobacco: Never Used  Substance and Sexual Activity  . Alcohol use: No    Alcohol/week: 0.0 standard drinks  . Drug use: No   . Sexual activity: Never  Lifestyle  . Physical activity    Days per week: Not on file    Minutes per session: Not on file  . Stress: Not on file  Relationships  . Social Herbalist on phone: Not on file    Gets together: Not on file    Attends religious service: Not on file    Active member of club or organization: Not on file    Attends meetings of clubs or organizations: Not on file    Relationship status: Not on file  . Intimate partner violence    Fear of current or ex partner: Not on file    Emotionally abused: Not on file    Physically abused: Not on file    Forced sexual activity: Not on file  Other Topics Concern  . Not on file  Social History Narrative  . Not on file    Review of Systems: See HPI, otherwise negative ROS  Physical Exam: BP 113/70   Pulse 73   Temp (!) 97.3 F (36.3 C) (Temporal)   Ht 5\' 5"  (1.651 m)   Wt 60.3 kg   SpO2 95%   BMI 22.13 kg/m  General:  Alert,  pleasant and cooperative in NAD Head:  Normocephalic and atraumatic. Neck:  Supple; no masses or thyromegaly. Lungs:  Clear throughout to auscultation.    Heart:  Regular rate and rhythm. Abdomen:  Soft, nontender and nondistended. Normal bowel sounds, without guarding, and without rebound.   Neurologic:  Alert and  oriented x4;  grossly normal neurologically.  Impression/Plan: Kathryn Willis is here for an colonoscopy to be performed for family history of colon cancer.  Risks, benefits, limitations, and alternatives regarding  colonoscopy have been reviewed with the patient.  Questions have been answered.  All parties agreeable.   Lucilla Lame, MD  02/16/2019, 11:13 AM

## 2019-02-16 NOTE — Transfer of Care (Signed)
Immediate Anesthesia Transfer of Care Note  Patient: Kathryn Willis  Procedure(s) Performed: COLONOSCOPY WITH BIOPSY (N/A Rectum) POLYPECTOMY (N/A Rectum)  Patient Location: PACU  Anesthesia Type: General  Level of Consciousness: awake, alert  and patient cooperative  Airway and Oxygen Therapy: Patient Spontanous Breathing and Patient connected to supplemental oxygen  Post-op Assessment: Post-op Vital signs reviewed, Patient's Cardiovascular Status Stable, Respiratory Function Stable, Patent Airway and No signs of Nausea or vomiting  Post-op Vital Signs: Reviewed and stable  Complications: No apparent anesthesia complications

## 2019-02-16 NOTE — Anesthesia Postprocedure Evaluation (Signed)
Anesthesia Post Note  Patient: Leslieann Whisman  Procedure(s) Performed: COLONOSCOPY WITH BIOPSY (N/A Rectum) POLYPECTOMY (N/A Rectum)  Patient location during evaluation: PACU Anesthesia Type: General Level of consciousness: awake and alert Pain management: pain level controlled Vital Signs Assessment: post-procedure vital signs reviewed and stable Respiratory status: spontaneous breathing, nonlabored ventilation, respiratory function stable and patient connected to nasal cannula oxygen Cardiovascular status: blood pressure returned to baseline and stable Postop Assessment: no apparent nausea or vomiting Anesthetic complications: no    Adele Barthel Shonta Phillis

## 2019-02-16 NOTE — Op Note (Signed)
[facility] Gastroenterology Patient Name: Kathryn Willis Procedure Date: 02/16/2019 10:21 AM MRN: [id number] Account #: [id number] Date of Birth: 06-Nov-1955 Admit Type: Outpatient Age: 63 Room: [facility] OR ROOM 01 Gender: Female Note Status: Finalized Procedure:            Colonoscopy Indications:          Family history of colon cancer in a first-degree                        relative before age 31 years Providers:            Lucilla Lame MD, MD Referring MD:         Juline Patch, MD (Referring MD) Medicines:            Propofol per Anesthesia Complications:        No immediate complications. Procedure:            Pre-Anesthesia Assessment:                       - Prior to the procedure, a History and Physical was                        performed, and patient medications and allergies were                        reviewed. The patient's tolerance of previous                        anesthesia was also reviewed. The risks and benefits of                        the procedure and the sedation options and risks were                        discussed with the patient. All questions were                        answered, and informed consent was obtained. Prior                        Anticoagulants: The patient has taken no previous                        anticoagulant or antiplatelet agents. ASA Grade                        Assessment: II - A patient with mild systemic disease.                        After reviewing the risks and benefits, the patient was                        deemed in satisfactory condition to undergo the                        procedure.                       After obtaining informed consent, the colonoscope was  passed under direct vision. Throughout the procedure,                        the patient's blood pressure, pulse, and oxygen                        saturations were monitored continuously. The was   introduced through the anus and advanced to the the                        cecum, identified by appendiceal orifice and ileocecal                        valve. The colonoscopy was performed without                        difficulty. The patient tolerated the procedure well.                        The quality of the bowel preparation was excellent. Findings:      The perianal and digital rectal examinations were normal.      A 3 mm polyp was found in the cecum. The polyp was sessile. The polyp       was removed with a cold biopsy forceps. Resection and retrieval were       complete.      Three sessile polyps were found in the transverse colon. The polyps were       3 to 5 mm in size. These polyps were removed with a cold biopsy forceps.       Resection and retrieval were complete.      Two sessile polyps were found in the descending colon. The polyps were 3       to 4 mm in size. These polyps were removed with a cold biopsy forceps.       Resection and retrieval were complete.      Two sessile polyps were found in the sigmoid colon. The polyps were 2 to       4 mm in size. These polyps were removed with a cold biopsy forceps.       Resection and retrieval were complete.      A 9 mm polyp was found in the sigmoid colon. The polyp was sessile. The       polyp was removed with a hot snare. Resection and retrieval were       complete.      Non-bleeding internal hemorrhoids were found during retroflexion. The       hemorrhoids were Grade II (internal hemorrhoids that prolapse but reduce       spontaneously). Impression:           - One 3 mm polyp in the cecum, removed with a cold                        biopsy forceps. Resected and retrieved.                       - Three 3 to 5 mm polyps in the transverse colon,                        removed with a cold biopsy forceps. Resected and  retrieved.                       - Two 3 to 4 mm polyps in the descending colon, removed                         with a cold biopsy forceps. Resected and retrieved.                       - Two 2 to 4 mm polyps in the sigmoid colon, removed                        with a cold biopsy forceps. Resected and retrieved.                       - One 9 mm polyp in the sigmoid colon, removed with a                        hot snare. Resected and retrieved.                       - Non-bleeding internal hemorrhoids. Recommendation:       - Discharge patient to home.                       - Resume previous diet.                       - Continue present medications.                       - Await pathology results.                       - Repeat colonoscopy in 5 years for surveillance. Lucilla Lame MD, MD 02/16/2019 11:42:16 AM This report has been signed electronically. Number of Addenda: 0 Note Initiated On: 02/16/2019 10:21 AM Scope Withdrawal Time: 0 hours 12 minutes 59 seconds  Total Procedure Duration: 0 hours 16 minutes 54 seconds  Estimated Blood Loss: Estimated blood loss: none.      [facility]

## 2019-02-17 ENCOUNTER — Encounter: Payer: Self-pay | Admitting: Gastroenterology

## 2019-02-23 ENCOUNTER — Encounter: Payer: Self-pay | Admitting: Gastroenterology

## 2019-02-23 ENCOUNTER — Ambulatory Visit
Admission: RE | Admit: 2019-02-23 | Discharge: 2019-02-23 | Disposition: A | Discharge status: Home / Self Care | Payer: BC Managed Care – PPO | Source: Ambulatory Visit | Attending: Obstetrics and Gynecology | Admitting: Obstetrics and Gynecology

## 2019-02-23 ENCOUNTER — Other Ambulatory Visit: Payer: Self-pay

## 2019-02-23 DIAGNOSIS — Z1231 Encounter for screening mammogram for malignant neoplasm of breast: Secondary | ICD-10-CM | POA: Insufficient documentation

## 2019-03-30 ENCOUNTER — Ambulatory Visit (INDEPENDENT_AMBULATORY_CARE_PROVIDER_SITE_OTHER): Payer: BC Managed Care – PPO | Admitting: Family Medicine

## 2019-03-30 ENCOUNTER — Other Ambulatory Visit: Payer: Self-pay

## 2019-03-30 ENCOUNTER — Encounter: Payer: Self-pay | Admitting: Family Medicine

## 2019-03-30 VITALS — BP 120/60 | HR 72 | Ht 65.0 in | Wt 137.0 lb

## 2019-03-30 DIAGNOSIS — Z23 Encounter for immunization: Secondary | ICD-10-CM | POA: Diagnosis not present

## 2019-03-30 DIAGNOSIS — L237 Allergic contact dermatitis due to plants, except food: Secondary | ICD-10-CM

## 2019-03-30 MED ORDER — PREDNISONE 10 MG PO TABS: ORAL_TABLET | ORAL | 1 refills | Status: DC

## 2019-03-30 NOTE — Progress Notes (Signed)
Date:  03/30/2019   Name:  Kathryn Willis   DOB:  [birth year]   MRN:  [id number]   Chief Complaint: Rash (pulling weeds on Friday- got poison oak) and influenza vacc need  Rash This is a new problem. The current episode started in the past 7 days. The problem has been gradually worsening since onset. The rash is diffuse. The rash is characterized by blistering, redness and itchiness. She was exposed to plant contact. Pertinent negatives include no anorexia, congestion, cough, diarrhea, eye pain, facial edema, fatigue, fever, joint pain, rhinorrhea, shortness of breath, sore throat or vomiting. Treatments tried: calamine lotion. The treatment provided mild relief.    Review of Systems  Constitutional: Negative.  Negative for chills, fatigue, fever and unexpected weight change.  HENT: Negative for congestion, ear discharge, ear pain, rhinorrhea, sinus pressure, sneezing and sore throat.   Eyes: Negative for photophobia, pain, discharge, redness and itching.  Respiratory: Negative for cough, shortness of breath, wheezing and stridor.   Gastrointestinal: Negative for abdominal pain, anorexia, blood in stool, constipation, diarrhea, nausea and vomiting.  Endocrine: Negative for cold intolerance, heat intolerance, polydipsia, polyphagia and polyuria.  Genitourinary: Negative for dysuria, flank pain, frequency, hematuria, menstrual problem, pelvic pain, urgency, vaginal bleeding and vaginal discharge.  Musculoskeletal: Negative for arthralgias, back pain, joint pain and myalgias.  Skin: Positive for rash.  Allergic/Immunologic: Negative for environmental allergies and food allergies.  Neurological: Negative for dizziness, weakness, light-headedness, numbness and headaches.  Hematological: Negative for adenopathy. Does not bruise/bleed easily.  Psychiatric/Behavioral: Negative for dysphoric mood. The patient is not nervous/anxious.     Patient Active Problem List   Diagnosis Date Noted  .  Family history of colon cancer   . Polyp of ascending colon     Allergies  Allergen Reactions  . Penicillins Anaphylaxis    Past Surgical History:  Procedure Laterality Date  . COLONOSCOPY WITH PROPOFOL N/A 02/16/2019   Procedure: COLONOSCOPY WITH BIOPSY;  Surgeon: Lucilla Lame, MD;  Location: [facility];  Service: Endoscopy;  Laterality: N/A;  . MOUTH SURGERY  12/19/2017   Bone graft  . POLYPECTOMY N/A 02/16/2019   Procedure: POLYPECTOMY;  Surgeon: Lucilla Lame, MD;  Location: [place];  Service: Endoscopy;  Laterality: N/A;    Social History   Tobacco Use  . Smoking status: Former Smoker    Packs/day: 1.00    Years: 34.00    Pack years: 34.00    Types: Cigarettes    Quit date: 12/19/2017    Years since quitting: 1.2  . Smokeless tobacco: Never Used  Substance Use Topics  . Alcohol use: No    Alcohol/week: 0.0 standard drinks  . Drug use: No     Medication list has been reviewed and updated.  Current Meds  Medication Sig  . sertraline (ZOLOFT) 50 MG tablet Take 1.5 tablets by mouth daily.  . VENTOLIN HFA 108 (90 Base) MCG/ACT inhaler INHALE 2 PUFFS INTO THE LUNGS EVERY 6 HOURS AS NEEDED FOR WHEEZING OR SHORTNESS OF BREATH    PHQ 2/9 Scores 01/16/2019  PHQ - 2 Score 5  PHQ- 9 Score 11    BP Readings from Last 3 Encounters:  03/30/19 120/60  02/16/19 115/77  01/16/19 120/72    Physical Exam Vitals signs and nursing note reviewed.  Constitutional:      General: She is not in acute distress.    Appearance: She is not diaphoretic.  HENT:     Head: Normocephalic and atraumatic.  Right Ear: Tympanic membrane and external ear normal.     Left Ear: Tympanic membrane and external ear normal.     Nose: Nose normal. No congestion.  Eyes:     General:        Right eye: No discharge.        Left eye: No discharge.     Conjunctiva/sclera: Conjunctivae normal.     Pupils: Pupils are equal, round, and reactive to light.  Neck:      Musculoskeletal: Normal range of motion and neck supple.     Thyroid: No thyromegaly.     Vascular: No JVD.  Cardiovascular:     Rate and Rhythm: Normal rate and regular rhythm.     Heart sounds: Normal heart sounds. No murmur. No friction rub. No gallop.   Pulmonary:     Effort: Pulmonary effort is normal.     Breath sounds: Normal breath sounds. No wheezing, rhonchi or rales.  Chest:     Chest wall: No tenderness.  Abdominal:     General: Bowel sounds are normal.     Palpations: Abdomen is soft. There is no mass.     Tenderness: There is no abdominal tenderness. There is no guarding.  Musculoskeletal: Normal range of motion.  Lymphadenopathy:     Cervical: No cervical adenopathy.  Skin:    General: Skin is warm and dry.     Findings: Rash present. Rash is vesicular.  Neurological:     Mental Status: She is alert.     Deep Tendon Reflexes: Reflexes are normal and symmetric.     Wt Readings from Last 3 Encounters:  03/30/19 137 lb (62.1 kg)  02/16/19 133 lb (60.3 kg)  01/27/19 135 lb (61.2 kg)    BP 120/60   Pulse 72   Ht 5\' 5"  (1.651 m)   Wt 137 lb (62.1 kg)   BMI 22.80 kg/m   Assessment and Plan:  1. Contact dermatitis due to poison oak Acute onset.  Uncontrolled.  Will initiate prednisone taper 60 mg over 2-week.  2. Influenza vaccine needed Discussed and administered - Flu Vaccine QUAD 6+ mos PF IM (Fluarix Quad PF)

## 2019-04-06 ENCOUNTER — Ambulatory Visit
Admission: EM | Admit: 2019-04-06 | Discharge: 2019-04-06 | Disposition: A | Discharge status: Home / Self Care | Payer: BC Managed Care – PPO | Attending: Family Medicine | Admitting: Family Medicine

## 2019-04-06 ENCOUNTER — Other Ambulatory Visit: Payer: Self-pay

## 2019-04-06 DIAGNOSIS — W260XXA Contact with knife, initial encounter: Secondary | ICD-10-CM

## 2019-04-06 DIAGNOSIS — S61012A Laceration without foreign body of left thumb without damage to nail, initial encounter: Secondary | ICD-10-CM

## 2019-04-06 NOTE — ED Triage Notes (Signed)
Pt states she was cutting an onion about 20 mins ago and sliced her left thumb slightly to the left of the fingernail. Unsure of last tdap, I will check her records.

## 2019-04-06 NOTE — ED Provider Notes (Signed)
[facility]    CSN: [id number] Arrival date & time: 04/06/19  1202      History   Chief Complaint Chief Complaint  Patient presents with  . Extremity Laceration   HPI  63 year old female presents with a laceration to her left thumb.  Patient states that she was making an omelette this morning and accidentally cut her left thumb.  Laceration is near the nailbed but did not injure the nail.  Bleeding is well controlled.  Last tetanus was in 2019.  Minimal pain at this time.  No medications taken.  Exacerbated by touch.  No other associated symptoms.  No other complaints.  PMH, Surgical Hx, Family Hx, Social History reviewed and updated as below.  Past Medical History:  Diagnosis Date  . Arthritis    finger  . Depression   . Emphysema of lung [facility])     Patient Active Problem List   Diagnosis Date Noted  . Family history of colon cancer   . Polyp of ascending colon     Past Surgical History:  Procedure Laterality Date  . COLONOSCOPY WITH PROPOFOL N/A 02/16/2019   Procedure: COLONOSCOPY WITH BIOPSY;  Surgeon: Lucilla Lame, MD;  Location: [facility];  Service: Endoscopy;  Laterality: N/A;  . MOUTH SURGERY  12/19/2017   Bone graft  . POLYPECTOMY N/A 02/16/2019   Procedure: POLYPECTOMY;  Surgeon: Lucilla Lame, MD;  Location: [place];  Service: Endoscopy;  Laterality: N/A;    OB History   No obstetric history on file.      Home Medications    Prior to Admission medications   Medication Sig Start Date End Date Taking? Authorizing Provider  predniSONE (DELTASONE) 10 MG tablet Taper 6,6,6,5,5,5,4,4,3,3,2,2,1,1 03/30/19  Yes Juline Patch, MD  sertraline (ZOLOFT) 50 MG tablet Take 1.5 tablets by mouth daily. 01/05/15  Yes [provider]  VENTOLIN HFA 108 (90 Base) MCG/ACT inhaler INHALE 2 PUFFS INTO THE LUNGS EVERY 6 HOURS AS NEEDED FOR WHEEZING OR SHORTNESS OF BREATH 10/13/18   Juline Patch, MD    Family History Family  History  Problem Relation Age of Onset  . Breast cancer Mother 85  . Breast cancer Cousin 74       mat cousin    Social History Social History   Tobacco Use  . Smoking status: Former Smoker    Packs/day: 1.00    Years: 34.00    Pack years: 34.00    Types: Cigarettes    Quit date: 12/19/2017    Years since quitting: 1.2  . Smokeless tobacco: Never Used  Substance Use Topics  . Alcohol use: No    Alcohol/week: 0.0 standard drinks  . Drug use: No     Allergies   Penicillins   Review of Systems Review of Systems  Constitutional: Negative.   Skin: Positive for wound.   Physical Exam Triage Vital Signs ED Triage Vitals  Enc Vitals Group     BP 04/06/19 1221 139/69     Pulse Rate 04/06/19 1221 95     Resp 04/06/19 1221 18     Temp 04/06/19 1221 98.5 F (36.9 C)     Temp Source 04/06/19 1221 Oral     SpO2 04/06/19 1221 98 %     Weight 04/06/19 1221 137 lb (62.1 kg)     Height --      Head Circumference --      Peak Flow --      Pain Score 04/06/19 1247  0     Pain Loc --      Pain Edu? --      Excl. in [place]? --    Updated Vital Signs BP 139/69 (BP Location: Left Arm)   Pulse 95   Temp 98.5 F (36.9 C) (Oral)   Resp 18   Wt 62.1 kg   SpO2 98%   BMI 22.80 kg/m   Visual Acuity Right Eye Distance:   Left Eye Distance:   Bilateral Distance:    Right Eye Near:   Left Eye Near:    Bilateral Near:     Physical Exam Vitals signs and nursing note reviewed.  Constitutional:      General: She is not in acute distress.    Appearance: Normal appearance.  HENT:     Head: Normocephalic and atraumatic.  Eyes:     General:        Right eye: No discharge.        Left eye: No discharge.     Conjunctiva/sclera: Conjunctivae normal.  Cardiovascular:     Rate and Rhythm: Normal rate and regular rhythm.     Heart sounds: No murmur.  Pulmonary:     Effort: Pulmonary effort is normal.     Breath sounds: Normal breath sounds. No wheezing, rhonchi or rales.  Skin:     Comments: Left thumb -small laceration less than 1 cm located on the ulnar aspect of the dorsum of the left thumb  Neurological:     Mental Status: She is alert.  Psychiatric:        Mood and Affect: Mood normal.        Behavior: Behavior normal.    UC Treatments / Results  Labs (all labs ordered are listed, but only abnormal results are displayed) Labs Reviewed - No data to display  EKG   Radiology No results found.  Procedures Laceration Repair  Date/Time: 04/06/2019 1:16 PM Performed by: Coral Spikes, DO Authorized by: Coral Spikes, DO   Consent:    Consent obtained:  Verbal   Consent given by:  Patient Anesthesia (see MAR for exact dosages):    Anesthesia method:  None Laceration details:    Location:  Finger   Finger location:  L thumb   Length (cm):  1 Repair type:    Repair type:  Simple Exploration:    Hemostasis achieved with:  Direct pressure Treatment:    Area cleansed with:  Soap and water Skin repair:    Repair method:  Tissue adhesive Approximation:    Approximation:  Close Post-procedure details:    Dressing:  Non-adherent dressing   Patient tolerance of procedure:  Tolerated well, no immediate complications   (including critical care time)  Medications Ordered in UC Medications - No data to display  Initial Impression / Assessment and Plan / UC Course  I have reviewed the triage vital signs and the nursing notes.  Pertinent labs & imaging results that were available during my care of the patient were reviewed by me and considered in my medical decision making (see chart for details).    63 year old female presents with a small laceration. Repaired with Dermabond. Tetanus up to date.  Final Clinical Impressions(s) / UC Diagnoses   Final diagnoses:  Laceration of left thumb without foreign body without damage to nail, initial encounter   Discharge Instructions   None    ED Prescriptions    None     Controlled Substance  Prescriptions [place] Controlled  Substance Registry consulted? Not Applicable   Coral Spikes, DO 04/06/19 1333

## 2019-05-05 DIAGNOSIS — Z01419 Encounter for gynecological examination (general) (routine) without abnormal findings: Secondary | ICD-10-CM | POA: Diagnosis not present

## 2019-06-17 DIAGNOSIS — L570 Actinic keratosis: Secondary | ICD-10-CM | POA: Diagnosis not present

## 2019-06-17 DIAGNOSIS — L578 Other skin changes due to chronic exposure to nonionizing radiation: Secondary | ICD-10-CM | POA: Diagnosis not present

## 2019-06-17 DIAGNOSIS — L821 Other seborrheic keratosis: Secondary | ICD-10-CM | POA: Diagnosis not present

## 2019-06-17 DIAGNOSIS — Z86018 Personal history of other benign neoplasm: Secondary | ICD-10-CM | POA: Diagnosis not present

## 2019-08-04 ENCOUNTER — Ambulatory Visit: Payer: BC Managed Care – PPO | Admitting: Family Medicine

## 2019-08-04 ENCOUNTER — Other Ambulatory Visit: Payer: Self-pay

## 2019-08-04 ENCOUNTER — Encounter: Payer: Self-pay | Admitting: Family Medicine

## 2019-08-04 VITALS — BP 102/64 | HR 80 | Ht 65.0 in | Wt 144.0 lb

## 2019-08-04 DIAGNOSIS — J0100 Acute maxillary sinusitis, unspecified: Secondary | ICD-10-CM

## 2019-08-04 DIAGNOSIS — B9789 Other viral agents as the cause of diseases classified elsewhere: Secondary | ICD-10-CM

## 2019-08-04 DIAGNOSIS — J028 Acute pharyngitis due to other specified organisms: Secondary | ICD-10-CM | POA: Diagnosis not present

## 2019-08-04 LAB — POCT RAPID STREP A (OFFICE): Rapid Strep A Screen: NEGATIVE

## 2019-08-04 MED ORDER — AZITHROMYCIN 250 MG PO TABS: ORAL_TABLET | ORAL | 0 refills | Status: DC

## 2019-08-04 NOTE — Progress Notes (Signed)
Date:  08/04/2019   Name:  Brandee Hopewell   DOB:  26-Jun-1956   MRN:  [id number]   Chief Complaint: Sore Throat (sore x 4 days, red and hurts to swallow.)  Sore Throat  This is a new problem. The current episode started in the past 7 days (4 days ago). The problem has been unchanged. The pain is worse on the right (moving to the left) side. There has been no fever. The pain is at a severity of 5/10. The pain is mild. Pertinent negatives include no abdominal pain, congestion, coughing, diarrhea, drooling, ear discharge, ear pain, headaches, hoarse voice, plugged ear sensation, neck pain, shortness of breath, stridor, swollen glands, trouble swallowing or vomiting. She has had no exposure to strep or mono. She has tried NSAIDs for the symptoms. The treatment provided mild relief.    No results found for: CREATININE, BUN, NA, K, CL, CO2 No results found for: CHOL, HDL, LDLCALC, LDLDIRECT, TRIG, CHOLHDL No results found for: TSH No results found for: HGBA1C   Review of Systems  Constitutional: Negative for chills and fever.  HENT: Negative for congestion, drooling, ear discharge, ear pain, hoarse voice, sore throat and trouble swallowing.   Respiratory: Negative for cough, shortness of breath, wheezing and stridor.   Cardiovascular: Negative for chest pain, palpitations and leg swelling.  Gastrointestinal: Negative for abdominal pain, blood in stool, constipation, diarrhea, nausea and vomiting.  Endocrine: Negative for polydipsia.  Genitourinary: Negative for dysuria, frequency, hematuria and urgency.  Musculoskeletal: Positive for arthralgias. Negative for back pain, myalgias and neck pain.  Skin: Positive for color change. Negative for rash.  Allergic/Immunologic: Negative for environmental allergies.  Neurological: Negative for dizziness and headaches.  Hematological: Does not bruise/bleed easily.  Psychiatric/Behavioral: Negative for suicidal ideas. The patient is not  nervous/anxious.     Patient Active Problem List   Diagnosis Date Noted  . Family history of colon cancer   . Polyp of ascending colon     Allergies  Allergen Reactions  . Penicillins Anaphylaxis    Past Surgical History:  Procedure Laterality Date  . COLONOSCOPY WITH PROPOFOL N/A 02/16/2019   Procedure: COLONOSCOPY WITH BIOPSY;  Surgeon: Lucilla Lame, MD;  Location: [facility];  Service: Endoscopy;  Laterality: N/A;  . MOUTH SURGERY  12/19/2017   Bone graft  . POLYPECTOMY N/A 02/16/2019   Procedure: POLYPECTOMY;  Surgeon: Lucilla Lame, MD;  Location: [facility];  Service: Endoscopy;  Laterality: N/A;    Social History   Tobacco Use  . Smoking status: Former Smoker    Packs/day: 1.00    Years: 34.00    Pack years: 34.00    Types: Cigarettes    Quit date: 12/19/2017    Years since quitting: 1.6  . Smokeless tobacco: Never Used  Substance Use Topics  . Alcohol use: No    Alcohol/week: 0.0 standard drinks  . Drug use: No     Medication list has been reviewed and updated.  Current Meds  Medication Sig  . sertraline (ZOLOFT) 100 MG tablet Take 1 tablet by mouth daily.   . VENTOLIN HFA 108 (90 Base) MCG/ACT inhaler INHALE 2 PUFFS INTO THE LUNGS EVERY 6 HOURS AS NEEDED FOR WHEEZING OR SHORTNESS OF BREATH    PHQ 2/9 Scores 08/04/2019 01/16/2019  PHQ - 2 Score 0 5  PHQ- 9 Score 0 11    BP Readings from Last 3 Encounters:  08/04/19 102/64  04/06/19 139/69  03/30/19 120/60    Physical  Exam Vitals reviewed.  Constitutional:      Appearance: She is well-developed.  HENT:     Head: Normocephalic.     Right Ear: Hearing, tympanic membrane, ear canal and external ear normal. No drainage, swelling or tenderness. No middle ear effusion. Tympanic membrane is not erythematous.     Left Ear: Hearing, tympanic membrane, ear canal and external ear normal. No drainage, swelling or tenderness.  No middle ear effusion. Tympanic membrane is not erythematous.      Nose: No congestion or rhinorrhea.     Right Turbinates: Not enlarged.     Left Turbinates: Not enlarged.     Right Sinus: Maxillary sinus tenderness present. No frontal sinus tenderness.     Left Sinus: Maxillary sinus tenderness present. No frontal sinus tenderness.     Mouth/Throat:     Lips: Pink. No lesions.     Mouth: Mucous membranes are moist. No oral lesions.     Dentition: Normal dentition.     Tongue: No lesions. Tongue does not deviate from midline.     Palate: No mass and lesions.     Pharynx: Oropharynx is clear. Uvula midline. Posterior oropharyngeal erythema present. No pharyngeal swelling, oropharyngeal exudate or uvula swelling.  Eyes:     General: Lids are everted, no foreign bodies appreciated. No scleral icterus.       Left eye: No foreign body or hordeolum.     Conjunctiva/sclera: Conjunctivae normal.     Right eye: Right conjunctiva is not injected.     Left eye: Left conjunctiva is not injected.     Pupils: Pupils are equal, round, and reactive to light.  Neck:     Thyroid: No thyromegaly.     Vascular: No JVD.     Trachea: No tracheal deviation.  Cardiovascular:     Rate and Rhythm: Normal rate and regular rhythm.     Heart sounds: Normal heart sounds. No murmur. No friction rub. No gallop.   Pulmonary:     Effort: Pulmonary effort is normal. No respiratory distress.     Breath sounds: Normal breath sounds. No decreased air movement. No decreased breath sounds, wheezing, rhonchi or rales.  Abdominal:     General: Bowel sounds are normal.     Palpations: Abdomen is soft. There is no mass.     Tenderness: There is no abdominal tenderness. There is no guarding or rebound.  Musculoskeletal:        General: No tenderness. Normal range of motion.     Cervical back: Normal range of motion and neck supple.  Lymphadenopathy:     Cervical: No cervical adenopathy.  Skin:    General: Skin is warm.     Findings: No rash.  Neurological:     Mental Status: She is  alert and oriented to person, place, and time.     Cranial Nerves: No cranial nerve deficit.     Deep Tendon Reflexes: Reflexes normal.  Psychiatric:        Mood and Affect: Mood is not anxious or depressed.     Wt Readings from Last 3 Encounters:  08/04/19 144 lb (65.3 kg)  04/06/19 137 lb (62.1 kg)  03/30/19 137 lb (62.1 kg)    BP 102/64   Pulse 80   Ht 5\' 5"  (1.651 m)   Wt 144 lb (65.3 kg)   BMI 23.96 kg/m   Assessment and Plan:  1. Acute maxillary sinusitis, recurrence not specified Acute.  Persistent.  Patient has had sinus  congestion which is not resolved over several days.  This is consistent with previous sinus infections and we will initiate azithromycin to 50 mg 2 today followed by 1 a day for 4 days. - azithromycin (ZITHROMAX) 250 MG tablet; 2 today then 1 a day for 4 days  Dispense: 6 tablet; Refill: 0  2. Sore throat (viral) Patient has had a sore throat which is noted to be negative on rapid strep.  There is no exudate but it is erythematous consistent with a viral pharyngitis.  Patient was instructed to use gargling and Tylenol for discomfort. - POCT rapid strep A

## 2019-08-14 DIAGNOSIS — R202 Paresthesia of skin: Secondary | ICD-10-CM | POA: Diagnosis not present

## 2019-08-19 DIAGNOSIS — R202 Paresthesia of skin: Secondary | ICD-10-CM | POA: Diagnosis not present

## 2019-08-20 DIAGNOSIS — G5603 Carpal tunnel syndrome, bilateral upper limbs: Secondary | ICD-10-CM | POA: Diagnosis not present

## 2019-12-10 ENCOUNTER — Encounter: Payer: Self-pay | Admitting: Nurse Practitioner

## 2019-12-14 ENCOUNTER — Telehealth: Payer: Self-pay | Admitting: *Deleted

## 2019-12-14 DIAGNOSIS — Z87891 Personal history of nicotine dependence: Secondary | ICD-10-CM

## 2019-12-14 NOTE — Telephone Encounter (Signed)
Patient has been notified that annual lung cancer screening low dose CT scan is due currently or will be in near future. Confirmed that patient is within the age range of 55-77, and asymptomatic, (no signs or symptoms of lung cancer). Patient denies illness that would prevent curative treatment for lung cancer if found. Verified smoking history, (former, quit 12/19/17, 34 pack year). The shared decision making visit was done 01/27/19. Patient is agreeable for CT scan being scheduled.

## 2020-01-27 ENCOUNTER — Other Ambulatory Visit: Payer: Self-pay

## 2020-01-27 ENCOUNTER — Ambulatory Visit
Admission: RE | Admit: 2020-01-27 | Discharge: 2020-01-27 | Disposition: A | Discharge status: Home / Self Care | Payer: BC Managed Care – PPO | Source: Ambulatory Visit | Attending: Oncology | Admitting: Oncology

## 2020-01-27 DIAGNOSIS — Z87891 Personal history of nicotine dependence: Secondary | ICD-10-CM | POA: Diagnosis not present

## 2020-01-29 ENCOUNTER — Encounter: Payer: Self-pay | Admitting: *Deleted

## 2020-02-25 ENCOUNTER — Other Ambulatory Visit: Payer: Self-pay | Admitting: Family Medicine

## 2020-02-25 DIAGNOSIS — Z1231 Encounter for screening mammogram for malignant neoplasm of breast: Secondary | ICD-10-CM

## 2020-03-10 ENCOUNTER — Ambulatory Visit: Payer: BC Managed Care – PPO

## 2020-03-21 ENCOUNTER — Ambulatory Visit
Admission: RE | Admit: 2020-03-21 | Discharge: 2020-03-21 | Disposition: A | Discharge status: Home / Self Care | Payer: BC Managed Care – PPO | Source: Ambulatory Visit | Attending: Family Medicine | Admitting: Family Medicine

## 2020-03-21 ENCOUNTER — Other Ambulatory Visit: Payer: Self-pay

## 2020-03-21 DIAGNOSIS — Z1231 Encounter for screening mammogram for malignant neoplasm of breast: Secondary | ICD-10-CM

## 2020-04-20 ENCOUNTER — Encounter: Payer: Self-pay | Admitting: Family Medicine

## 2020-05-09 DIAGNOSIS — N72 Inflammatory disease of cervix uteri: Secondary | ICD-10-CM | POA: Diagnosis not present

## 2020-05-09 DIAGNOSIS — Z1151 Encounter for screening for human papillomavirus (HPV): Secondary | ICD-10-CM | POA: Diagnosis not present

## 2020-05-09 DIAGNOSIS — Z780 Asymptomatic menopausal state: Secondary | ICD-10-CM | POA: Diagnosis not present

## 2020-05-09 DIAGNOSIS — Z0000 Encounter for general adult medical examination without abnormal findings: Secondary | ICD-10-CM | POA: Diagnosis not present

## 2020-05-09 DIAGNOSIS — Z124 Encounter for screening for malignant neoplasm of cervix: Secondary | ICD-10-CM | POA: Diagnosis not present

## 2020-05-09 DIAGNOSIS — Z01419 Encounter for gynecological examination (general) (routine) without abnormal findings: Secondary | ICD-10-CM | POA: Diagnosis not present

## 2020-05-09 DIAGNOSIS — N3946 Mixed incontinence: Secondary | ICD-10-CM | POA: Diagnosis not present

## 2020-05-09 LAB — HM PAP SMEAR: HM Pap smear: NEGATIVE

## 2020-05-16 ENCOUNTER — Encounter: Payer: Self-pay | Admitting: Family Medicine

## 2020-05-16 ENCOUNTER — Other Ambulatory Visit: Payer: Self-pay

## 2020-05-16 ENCOUNTER — Ambulatory Visit (INDEPENDENT_AMBULATORY_CARE_PROVIDER_SITE_OTHER): Payer: BC Managed Care – PPO | Admitting: Family Medicine

## 2020-05-16 VITALS — BP 130/80 | HR 80 | Ht 64.0 in | Wt 147.0 lb

## 2020-05-16 DIAGNOSIS — F4323 Adjustment disorder with mixed anxiety and depressed mood: Secondary | ICD-10-CM | POA: Diagnosis not present

## 2020-05-16 MED ORDER — BUPROPION HCL ER (XL) 150 MG PO TB24: 150.0000 mg | ORAL_TABLET | Freq: Every day | ORAL | 5 refills | Status: DC

## 2020-05-16 NOTE — Progress Notes (Signed)
Date:  05/16/2020   Name:  Kathryn Willis   DOB:  [birth year]   MRN:  [id number]   Chief Complaint: anxiety (6 and 13)  Depression        This is a chronic problem.  The current episode started more than 1 year ago. The problem is unchanged.  Associated symptoms include no decreased concentration, no fatigue, no helplessness, no hopelessness, does not have insomnia, not irritable, no restlessness, no decreased interest, no appetite change, no body aches, no myalgias, no headaches, no indigestion, not sad and no suicidal ideas.  Past treatments include SSRIs - Selective serotonin reuptake inhibitors.  Previous treatment provided mild relief.  Past medical history includes anxiety.   Anxiety Presents for follow-up visit. Symptoms include excessive worry and nervous/anxious behavior. Patient reports no decreased concentration, dizziness, insomnia, nausea, panic, restlessness, shortness of breath or suicidal ideas.      No results found for: CREATININE, BUN, NA, K, CL, CO2 No results found for: CHOL, HDL, LDLCALC, LDLDIRECT, TRIG, CHOLHDL No results found for: TSH No results found for: HGBA1C No results found for: WBC, HGB, HCT, MCV, PLT No results found for: ALT, AST, GGT, ALKPHOS, BILITOT   Review of Systems  Constitutional: Negative.  Negative for appetite change, chills, fatigue, fever and unexpected weight change.  HENT: Negative for congestion, ear discharge, ear pain, rhinorrhea, sinus pressure, sneezing and sore throat.   Eyes: Negative for photophobia, pain, discharge, redness and itching.  Respiratory: Negative for cough, shortness of breath, wheezing and stridor.   Gastrointestinal: Negative for abdominal pain, blood in stool, constipation, diarrhea, nausea and vomiting.  Endocrine: Negative for cold intolerance, heat intolerance, polydipsia, polyphagia and polyuria.  Genitourinary: Negative for dysuria, flank pain, frequency, hematuria, menstrual problem, pelvic pain,  urgency, vaginal bleeding and vaginal discharge.  Musculoskeletal: Negative for arthralgias, back pain and myalgias.  Skin: Negative for rash.  Allergic/Immunologic: Negative for environmental allergies and food allergies.  Neurological: Negative for dizziness, weakness, light-headedness, numbness and headaches.  Hematological: Negative for adenopathy. Does not bruise/bleed easily.  Psychiatric/Behavioral: Positive for depression. Negative for decreased concentration, dysphoric mood and suicidal ideas. The patient is nervous/anxious. The patient does not have insomnia.     Patient Active Problem List   Diagnosis Date Noted  . Family history of colon cancer   . Polyp of ascending colon     Allergies  Allergen Reactions  . Penicillins Anaphylaxis    Past Surgical History:  Procedure Laterality Date  . COLONOSCOPY WITH PROPOFOL N/A 02/16/2019   Procedure: COLONOSCOPY WITH BIOPSY;  Surgeon: Lucilla Lame, MD;  Location: [facility];  Service: Endoscopy;  Laterality: N/A;  . MOUTH SURGERY  12/19/2017   Bone graft  . POLYPECTOMY N/A 02/16/2019   Procedure: POLYPECTOMY;  Surgeon: Lucilla Lame, MD;  Location: [facility];  Service: Endoscopy;  Laterality: N/A;    Social History   Tobacco Use  . Smoking status: Former Smoker    Packs/day: 1.00    Years: 34.00    Pack years: 34.00    Types: Cigarettes    Quit date: 12/19/2017    Years since quitting: 2.4  . Smokeless tobacco: Never Used  Vaping Use  . Vaping Use: Never used  Substance Use Topics  . Alcohol use: No    Alcohol/week: 0.0 standard drinks  . Drug use: No     Medication list has been reviewed and updated.  Current Meds  Medication Sig  . sertraline (ZOLOFT) 100 MG tablet Take 1  tablet by mouth daily. [facility]    PHQ 2/9 Scores 05/16/2020 08/04/2019 01/16/2019  PHQ - 2 Score 4 0 5  PHQ- 9 Score 6 0 11    GAD 7 : Generalized Anxiety Score 05/16/2020 08/04/2019  Nervous, Anxious, on Edge 3 0   Control/stop worrying 3 0  Worry too much - different things 2 0  Trouble relaxing 1 0  Restless 1 0  Easily annoyed or irritable 3 0  Afraid - awful might happen 0 0  Total GAD 7 Score 13 0  Anxiety Difficulty Not difficult at all -    BP Readings from Last 3 Encounters:  05/16/20 130/80  08/04/19 102/64  04/06/19 139/69    Physical Exam Vitals and nursing note reviewed.  Constitutional:      General: She is not irritable.    Appearance: She is well-developed.  HENT:     Head: Normocephalic.     Right Ear: External ear normal.     Left Ear: External ear normal.  Eyes:     General: Lids are everted, no foreign bodies appreciated. No scleral icterus.       Left eye: No foreign body or hordeolum.     Conjunctiva/sclera: Conjunctivae normal.     Right eye: Right conjunctiva is not injected.     Left eye: Left conjunctiva is not injected.     Pupils: Pupils are equal, round, and reactive to light.  Neck:     Thyroid: No thyromegaly.     Vascular: No JVD.     Trachea: No tracheal deviation.  Cardiovascular:     Rate and Rhythm: Normal rate and regular rhythm.     Heart sounds: Normal heart sounds. No murmur heard.  No friction rub. No gallop.   Pulmonary:     Effort: Pulmonary effort is normal. No respiratory distress.     Breath sounds: Normal breath sounds. No wheezing or rales.  Abdominal:     General: Bowel sounds are normal.     Palpations: Abdomen is soft. There is no mass.     Tenderness: There is no abdominal tenderness. There is no guarding or rebound.  Musculoskeletal:        General: No tenderness. Normal range of motion.     Cervical back: Normal range of motion and neck supple.  Lymphadenopathy:     Cervical: No cervical adenopathy.  Skin:    General: Skin is warm.     Findings: No rash.  Neurological:     Mental Status: She is alert and oriented to person, place, and time.     Cranial Nerves: No cranial nerve deficit.  Psychiatric:        Mood and  Affect: Mood is not anxious or depressed.     Wt Readings from Last 3 Encounters:  05/16/20 147 lb (66.7 kg)  01/27/20 148 lb (67.1 kg)  08/04/19 144 lb (65.3 kg)    BP 130/80   Pulse 80   Ht 5\' 4"  (1.626 m)   Wt 147 lb (66.7 kg)   BMI 25.23 kg/m   Assessment and Plan: 1. Adjustment reaction with anxiety and depression Chronic.  Uncontrolled.  Stable.  PHQ 6 gad score 3.  Patient has been on sertraline but desires to discontinue.  Patient will discontinue in the following that she will take 75 mg for 5 days dropped to 50 mg for 5 days then drop to 25 mg for 5 days although I think patient is probably discontinued on  her own off-and-on.  Upon cessation of sertraline patient will initiate bupropion 150 mg XL 1 a day per patient's request since she took one of her daughters and she did very well seemingly from taking 1 pill. - buPROPion (WELLBUTRIN XL) 150 MG 24 hr tablet; Take 1 tablet (150 mg total) by mouth daily.  Dispense: 30 tablet; Refill: 5

## 2020-06-06 ENCOUNTER — Other Ambulatory Visit: Payer: Self-pay

## 2020-06-06 DIAGNOSIS — F4323 Adjustment disorder with mixed anxiety and depressed mood: Secondary | ICD-10-CM

## 2020-06-06 MED ORDER — BUPROPION HCL ER (XL) 300 MG PO TB24: 300.0000 mg | ORAL_TABLET | Freq: Every day | ORAL | 0 refills | Status: DC

## 2020-06-06 NOTE — Progress Notes (Unsigned)
Sent in buproprion 300mg  XL

## 2020-06-06 NOTE — Telephone Encounter (Signed)
buPROPion (WELLBUTRIN XL) 150 MG 24 hr tablet Medication Date: 05/16/2020 Department: [facility] Ordering/Authorizing: Juline Patch, MD   Pt states that it was discussed that she may need to mov up to 300 mg on this, 5 refills on 150mg , please have Baxter Flattery call to discuss increase. Fu [phone]

## 2020-06-07 DIAGNOSIS — R278 Other lack of coordination: Secondary | ICD-10-CM | POA: Diagnosis not present

## 2020-06-07 DIAGNOSIS — N3946 Mixed incontinence: Secondary | ICD-10-CM | POA: Diagnosis not present

## 2020-06-16 DIAGNOSIS — Z86018 Personal history of other benign neoplasm: Secondary | ICD-10-CM | POA: Diagnosis not present

## 2020-06-16 DIAGNOSIS — L578 Other skin changes due to chronic exposure to nonionizing radiation: Secondary | ICD-10-CM | POA: Diagnosis not present

## 2020-06-16 DIAGNOSIS — L570 Actinic keratosis: Secondary | ICD-10-CM | POA: Diagnosis not present

## 2020-06-16 DIAGNOSIS — D485 Neoplasm of uncertain behavior of skin: Secondary | ICD-10-CM | POA: Diagnosis not present

## 2020-06-16 DIAGNOSIS — Z872 Personal history of diseases of the skin and subcutaneous tissue: Secondary | ICD-10-CM | POA: Diagnosis not present

## 2020-06-16 DIAGNOSIS — C4359 Malignant melanoma of other part of trunk: Secondary | ICD-10-CM | POA: Diagnosis not present

## 2020-06-16 DIAGNOSIS — L918 Other hypertrophic disorders of the skin: Secondary | ICD-10-CM | POA: Diagnosis not present

## 2020-06-17 ENCOUNTER — Ambulatory Visit: Payer: BC Managed Care – PPO | Admitting: Family Medicine

## 2020-06-17 ENCOUNTER — Ambulatory Visit
Admission: RE | Admit: 2020-06-17 | Discharge: 2020-06-17 | Disposition: A | Discharge status: Home / Self Care | Payer: BC Managed Care – PPO | Source: Ambulatory Visit | Attending: Family Medicine | Admitting: Family Medicine

## 2020-06-17 ENCOUNTER — Encounter: Payer: Self-pay | Admitting: Family Medicine

## 2020-06-17 ENCOUNTER — Ambulatory Visit
Admission: RE | Admit: 2020-06-17 | Discharge: 2020-06-17 | Disposition: A | Discharge status: Home / Self Care | Payer: BC Managed Care – PPO | Attending: Family Medicine | Admitting: Family Medicine

## 2020-06-17 ENCOUNTER — Other Ambulatory Visit: Payer: Self-pay

## 2020-06-17 VITALS — BP 138/70 | HR 88 | Ht 64.0 in | Wt 144.0 lb

## 2020-06-17 DIAGNOSIS — S8991XA Unspecified injury of right lower leg, initial encounter: Secondary | ICD-10-CM | POA: Diagnosis not present

## 2020-06-17 DIAGNOSIS — S9001XA Contusion of right ankle, initial encounter: Secondary | ICD-10-CM

## 2020-06-17 DIAGNOSIS — F4323 Adjustment disorder with mixed anxiety and depressed mood: Secondary | ICD-10-CM

## 2020-06-17 MED ORDER — MELOXICAM 7.5 MG PO TABS: 7.5000 mg | ORAL_TABLET | Freq: Every day | ORAL | 0 refills | Status: DC

## 2020-06-17 NOTE — Progress Notes (Signed)
Date:  06/17/2020   Name:  Kathryn Willis   DOB:  02/28/1956   MRN:  [id number]   Chief Complaint: Ankle Pain (hit ankle on something x 2 weeks ago- still having pain and is taking Ibuprofen for it. Wearing ankle brace, but not getting better) and Anxiety (tried coming off sertraline and taking Wellbutrin XL 300mg  daily- seems to have gotten worse)  Ankle Pain  The incident occurred more than 1 week ago (2 weeks ago). Injury mechanism: hit on something. The pain is present in the right ankle. The quality of the pain is described as aching. The pain is mild. Pertinent negatives include no inability to bear weight, loss of motion, loss of sensation, muscle weakness, numbness or tingling. The treatment provided moderate relief.  Anxiety Presents for follow-up visit. Symptoms include depressed mood, excessive worry, irritability and nervous/anxious behavior. Patient reports no dizziness, nausea or shortness of breath.      No results found for: CREATININE, BUN, NA, K, CL, CO2 No results found for: CHOL, HDL, LDLCALC, LDLDIRECT, TRIG, CHOLHDL No results found for: TSH No results found for: HGBA1C No results found for: WBC, HGB, HCT, MCV, PLT No results found for: ALT, AST, GGT, ALKPHOS, BILITOT   Review of Systems  Constitutional: Positive for irritability. Negative for chills, fever and unexpected weight change.  HENT: Negative for congestion, ear discharge, ear pain, rhinorrhea, sinus pressure, sneezing and sore throat.   Eyes: Negative for photophobia, pain, discharge, redness and itching.  Respiratory: Negative for cough, shortness of breath, wheezing and stridor.   Gastrointestinal: Negative for abdominal pain, blood in stool, constipation, diarrhea, nausea and vomiting.  Endocrine: Negative for cold intolerance, heat intolerance, polydipsia, polyphagia and polyuria.  Genitourinary: Negative for dysuria, flank pain, frequency, hematuria, menstrual problem, pelvic pain, urgency,  vaginal bleeding and vaginal discharge.  Musculoskeletal: Negative for arthralgias and back pain.  Skin: Negative for rash.  Allergic/Immunologic: Negative for environmental allergies and food allergies.  Neurological: Negative for dizziness, tingling, weakness, light-headedness and numbness.  Hematological: Negative for adenopathy. Does not bruise/bleed easily.  Psychiatric/Behavioral: Negative for dysphoric mood. The patient is nervous/anxious.     Patient Active Problem List   Diagnosis Date Noted  . Family history of colon cancer   . Polyp of ascending colon     Allergies  Allergen Reactions  . Penicillins Anaphylaxis    Past Surgical History:  Procedure Laterality Date  . COLONOSCOPY WITH PROPOFOL N/A 02/16/2019   Procedure: COLONOSCOPY WITH BIOPSY;  Surgeon: Lucilla Lame, MD;  Location: [facility];  Service: Endoscopy;  Laterality: N/A;  . MOUTH SURGERY  12/19/2017   Bone graft  . POLYPECTOMY N/A 02/16/2019   Procedure: POLYPECTOMY;  Surgeon: Lucilla Lame, MD;  Location: [facility];  Service: Endoscopy;  Laterality: N/A;    Social History   Tobacco Use  . Smoking status: Former Smoker    Packs/day: 1.00    Years: 34.00    Pack years: 34.00    Types: Cigarettes    Quit date: 12/19/2017    Years since quitting: 2.4  . Smokeless tobacco: Never Used  Vaping Use  . Vaping Use: Never used  Substance Use Topics  . Alcohol use: No    Alcohol/week: 0.0 standard drinks  . Drug use: No     Medication list has been reviewed and updated.  Current Meds  Medication Sig  . buPROPion (WELLBUTRIN XL) 300 MG 24 hr tablet Take 1 tablet (300 mg total) by mouth daily.  PHQ 2/9 Scores 06/17/2020 05/16/2020 08/04/2019 01/16/2019  PHQ - 2 Score 0 4 0 5  PHQ- 9 Score 0 6 0 11    GAD 7 : Generalized Anxiety Score 06/17/2020 05/16/2020 08/04/2019  Nervous, Anxious, on Edge 1 3 0  Control/stop worrying 2 3 0  Worry too much - different things 2 2 0  Trouble  relaxing 3 1 0  Restless 0 1 0  Easily annoyed or irritable 3 3 0  Afraid - awful might happen 0 0 0  Total GAD 7 Score 11 13 0  Anxiety Difficulty Extremely difficult Not difficult at all -    BP Readings from Last 3 Encounters:  06/17/20 138/70  05/16/20 130/80  08/04/19 102/64    Physical Exam Vitals and nursing note reviewed.  Constitutional:      General: She is not in acute distress.    Appearance: She is not diaphoretic.  HENT:     Head: Normocephalic and atraumatic.     Right Ear: Tympanic membrane, ear canal and external ear normal.     Left Ear: Tympanic membrane, ear canal and external ear normal.     Nose: Nose normal.  Eyes:     General:        Right eye: No discharge.        Left eye: No discharge.     Conjunctiva/sclera: Conjunctivae normal.     Pupils: Pupils are equal, round, and reactive to light.  Neck:     Thyroid: No thyromegaly.     Vascular: No JVD.  Cardiovascular:     Rate and Rhythm: Normal rate and regular rhythm.     Heart sounds: Normal heart sounds, S1 normal and S2 normal. No murmur heard.  No systolic murmur is present.  No diastolic murmur is present.  No friction rub. No gallop. No S3 or S4 sounds.   Pulmonary:     Effort: Pulmonary effort is normal.     Breath sounds: Normal breath sounds.  Abdominal:     General: Bowel sounds are normal.     Palpations: Abdomen is soft. There is no mass.     Tenderness: There is no abdominal tenderness. There is no guarding.  Musculoskeletal:        General: Normal range of motion.     Cervical back: Normal range of motion and neck supple.  Lymphadenopathy:     Cervical: No cervical adenopathy.  Skin:    General: Skin is warm and dry.  Neurological:     Mental Status: She is alert.     Deep Tendon Reflexes: Reflexes are normal and symmetric.     Wt Readings from Last 3 Encounters:  06/17/20 144 lb (65.3 kg)  05/16/20 147 lb (66.7 kg)  01/27/20 148 lb (67.1 kg)    BP 138/70   Pulse  88   Ht 5\' 4"  (1.626 m)   Wt 144 lb (65.3 kg)   BMI 24.72 kg/m   Assessment and Plan:                                                    Patient's chart was reviewed for previous encounters and ups. 1. Adjustment reaction with anxiety and depression Chronic.  Uncontrolled anxiety.  Depression controlled.  PHQ is 0.  Gad score is 11.  Relatively stable.  Patient  is currently on bupropion XL 300 mg and per her gynecologist sertraline 100 mg.  Daily.  We will refer to psychiatry at [facility] for evaluation and treatment thereof. - Ambulatory referral to Psychiatry  2. Contusion of right ankle, initial encounter New onset.  Tenderness over the distal third of the right fibula.  This is been going on for approximately 2 weeks.  We will treat with meloxicam 7.5 mg once a day and will obtain an x-ray of the tib-fib to rule out fracture. - meloxicam (MOBIC) 7.5 MG tablet; Take 1 tablet (7.5 mg total) by mouth daily.  Dispense: 30 tablet; Refill: 0 - DG Tibia/Fibula Right; Future

## 2020-06-20 ENCOUNTER — Encounter: Payer: Self-pay | Admitting: Family Medicine

## 2020-06-21 ENCOUNTER — Encounter: Payer: Self-pay | Admitting: Family Medicine

## 2020-07-04 ENCOUNTER — Ambulatory Visit: Payer: BC Managed Care – PPO | Admitting: Family Medicine

## 2020-07-07 DIAGNOSIS — C4359 Malignant melanoma of other part of trunk: Secondary | ICD-10-CM | POA: Diagnosis not present

## 2020-07-07 DIAGNOSIS — D235 Other benign neoplasm of skin of trunk: Secondary | ICD-10-CM | POA: Diagnosis not present

## 2020-07-07 IMAGING — CT CT CHEST LUNG CANCER SCREENING LOW DOSE
2 of 5 series · 15 of 40 positions shown, 18 images · non-contrast
Comparison: June 01, 2011 diagnostic CT. Chest radiograph of
07/26/2017 is also reviewed. No prior screening CT.

CLINICAL DATA: Ex-smoker, quitting 1 year ago. Thirty-four
pack-year history.

EXAM:
CT CHEST WITHOUT CONTRAST LOW-DOSE FOR LUNG CANCER SCREENING
TECHNIQUE: Multidetector CT imaging of the chest was performed following the
standard protocol without IV contrast.

[Series 3: lung · axial · 0.57mm/px · z∈[-1190,-902]mm · 12 of 322 slices shown, 15 images]
[im 17/322  mediastinal]
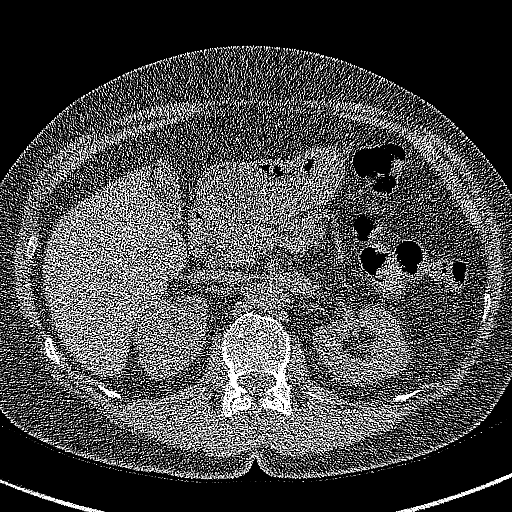
[im 17/322  lung]
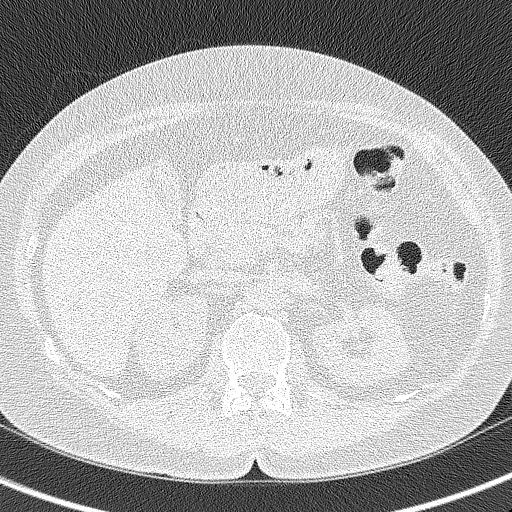
[im 49/322  lung]
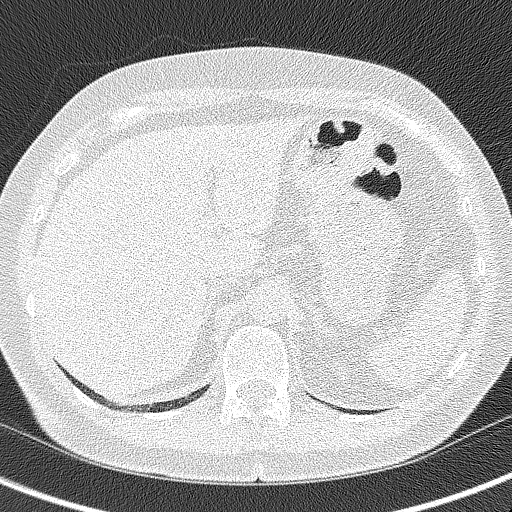
[im 65/322  lung]
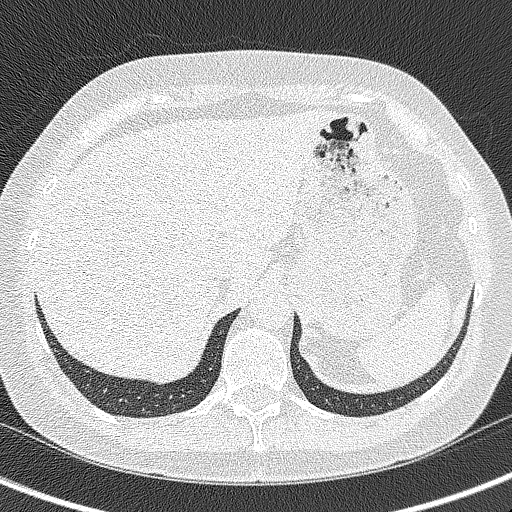
[im 97/322  lung]
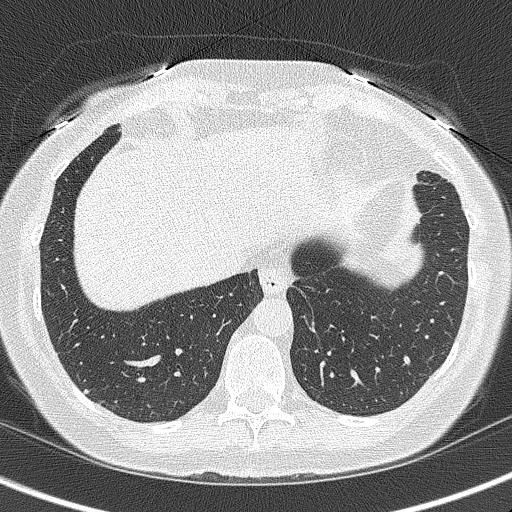
[im 129/322  mediastinal]
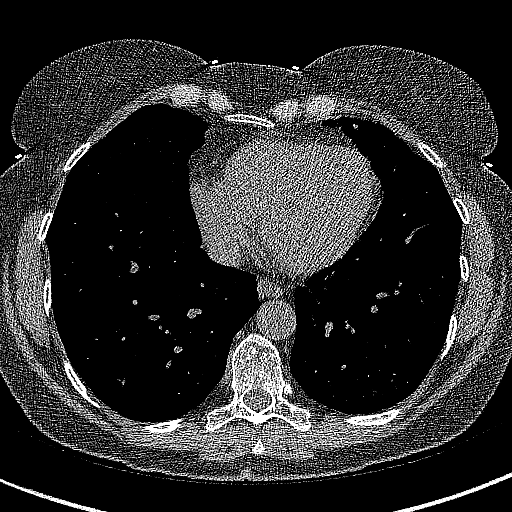
[im 129/322  lung]
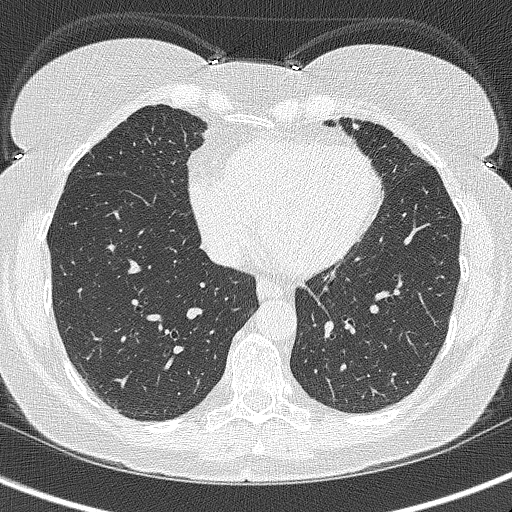
[im 145/322  lung]
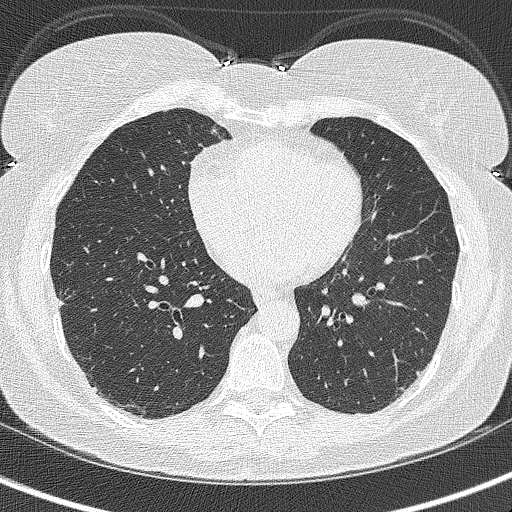
[im 177/322  lung]
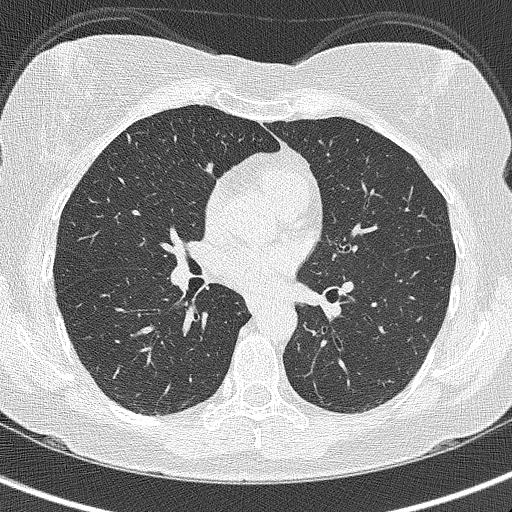
[im 193/322  lung]
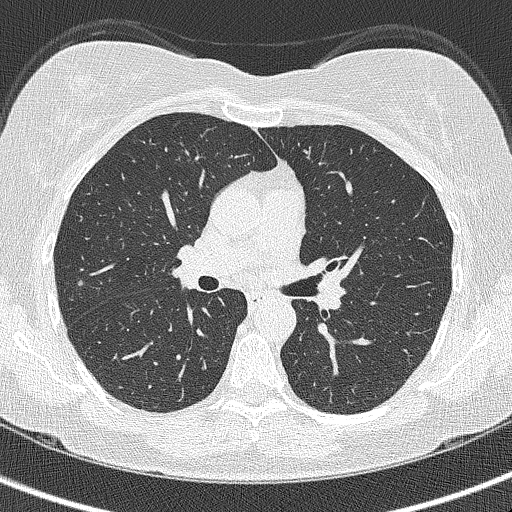
[im 225/322  mediastinal]
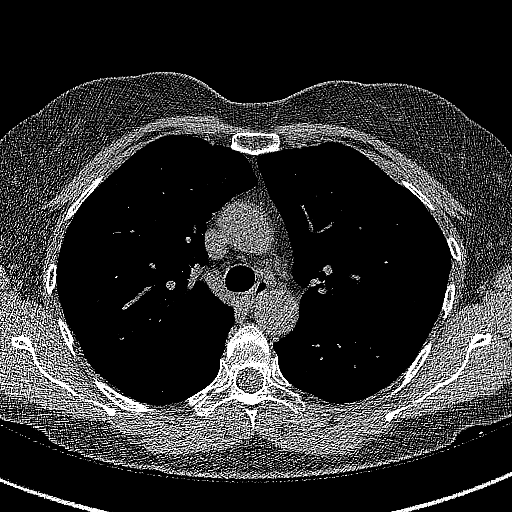
[im 225/322  lung]
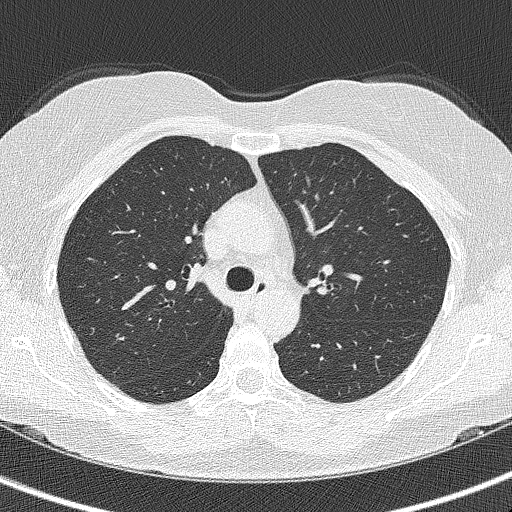
[im 257/322  lung]
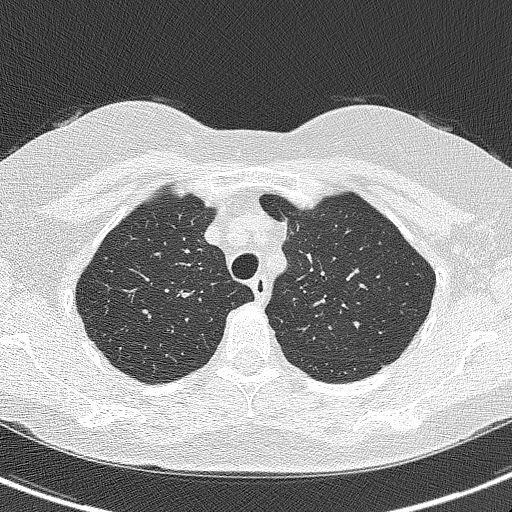
[im 273/322  lung]
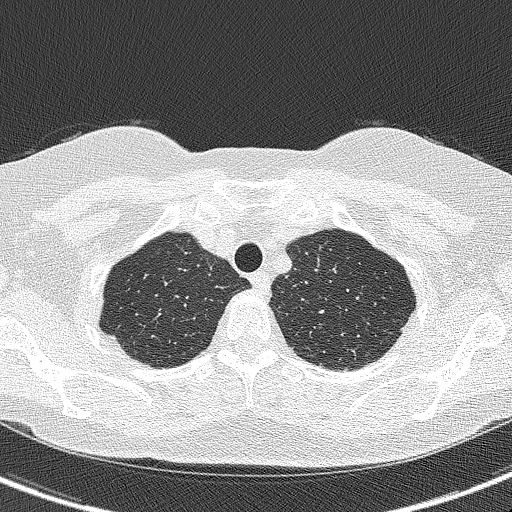
[im 305/322  lung]
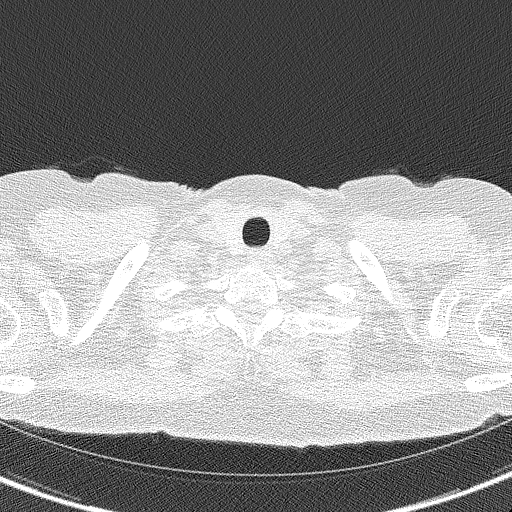

[Series 4: coronal lung · coronal · 0.57mm/px · 3 of 258 slices shown]
[im 52/258  lung]
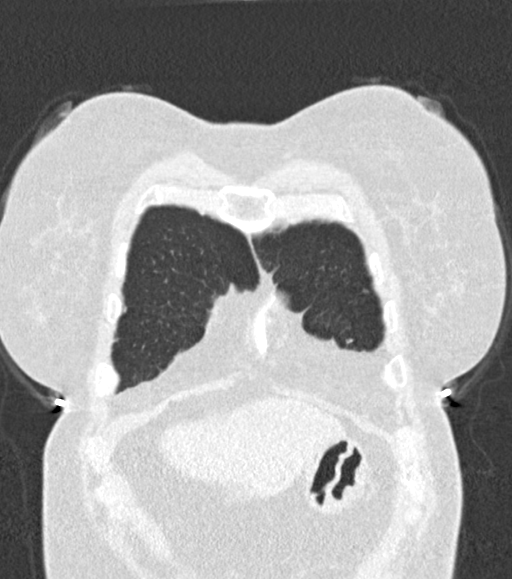
[im 103/258  lung]
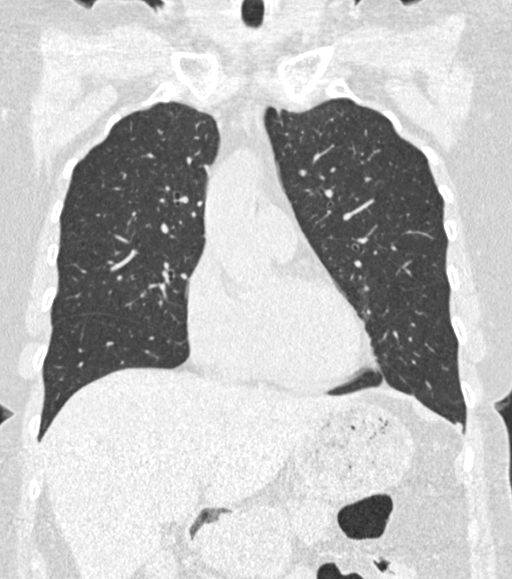
[im 155/258  lung]
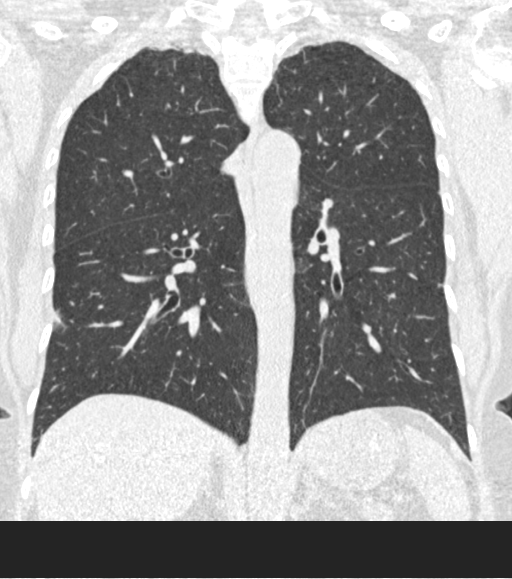

[15 of 40 positions shown; findings below may reference images not displayed]

FINDINGS: Cardiovascular: Aortic atherosclerosis. Normal heart size, without
pericardial effusion.

Mediastinum/Nodes: No mediastinal or definite hilar adenopathy,
given limitations of unenhanced CT.

Lungs/Pleura: No pleural fluid. Mild biapical pleuroparenchymal
scarring. Mild centrilobular emphysema.

Bilateral, subpleural predominant pulmonary nodules measure
maximally volume derived equivalent diameter 5.4 mm. Most likely
subpleural lymph nodes.

Upper Abdomen: Normal imaged portions of the liver, spleen, stomach,
pancreas, gallbladder, adrenal glands, kidneys.

Musculoskeletal: No acute osseous abnormality.
IMPRESSION: 1. Lung-RADS 2, benign appearance or behavior. Continue annual
screening with low-dose chest CT without contrast in 12 months.
2. Aortic atherosclerosis (6GQI6-QUV.V) and emphysema (6GQI6-GON.D).

## 2020-07-12 ENCOUNTER — Encounter: Payer: Self-pay | Admitting: Family Medicine

## 2020-07-18 DIAGNOSIS — F418 Other specified anxiety disorders: Secondary | ICD-10-CM | POA: Diagnosis not present

## 2020-07-18 DIAGNOSIS — Z1389 Encounter for screening for other disorder: Secondary | ICD-10-CM | POA: Diagnosis not present

## 2020-07-18 DIAGNOSIS — Z79899 Other long term (current) drug therapy: Secondary | ICD-10-CM | POA: Diagnosis not present

## 2020-08-15 DIAGNOSIS — F418 Other specified anxiety disorders: Secondary | ICD-10-CM | POA: Diagnosis not present

## 2020-09-27 DIAGNOSIS — F418 Other specified anxiety disorders: Secondary | ICD-10-CM | POA: Diagnosis not present

## 2020-10-14 ENCOUNTER — Ambulatory Visit: Payer: BC Managed Care – PPO | Admitting: Family Medicine

## 2020-10-14 ENCOUNTER — Encounter: Payer: Self-pay | Admitting: Family Medicine

## 2020-10-14 VITALS — BP 120/80 | HR 88 | Ht 64.0 in | Wt 149.0 lb

## 2020-10-14 DIAGNOSIS — J0100 Acute maxillary sinusitis, unspecified: Secondary | ICD-10-CM

## 2020-10-14 MED ORDER — AZITHROMYCIN 250 MG PO TABS: ORAL_TABLET | ORAL | 0 refills | Status: DC

## 2020-10-14 NOTE — Progress Notes (Signed)
Date:  10/14/2020   Name:  Kathryn Willis   DOB:  14-Dec-1955   MRN:  [id number]   Chief Complaint: Sore Throat (cough)  Sinusitis This is a new problem. The current episode started in the past 7 days. The problem has been gradually worsening since onset. There has been no fever. The pain is moderate. Associated symptoms include congestion and sinus pressure. Pertinent negatives include no chills, coughing, diaphoresis, ear pain, headaches, hoarse voice, neck pain, shortness of breath, sneezing, sore throat or swollen glands. Past treatments include oral decongestants. The treatment provided mild relief.    No results found for: CREATININE, BUN, NA, K, CL, CO2 No results found for: CHOL, HDL, LDLCALC, LDLDIRECT, TRIG, CHOLHDL No results found for: TSH No results found for: HGBA1C No results found for: WBC, HGB, HCT, MCV, PLT No results found for: ALT, AST, GGT, ALKPHOS, BILITOT   Review of Systems  Constitutional: Negative.  Negative for chills, diaphoresis, fatigue, fever and unexpected weight change.  HENT: Positive for congestion and sinus pressure. Negative for ear discharge, ear pain, hoarse voice, rhinorrhea, sneezing and sore throat.   Eyes: Negative for photophobia, pain, discharge, redness and itching.  Respiratory: Negative for cough, shortness of breath, wheezing and stridor.   Gastrointestinal: Negative for abdominal pain, blood in stool, constipation, diarrhea, nausea and vomiting.  Endocrine: Negative for cold intolerance, heat intolerance, polydipsia, polyphagia and polyuria.  Genitourinary: Negative for dysuria, flank pain, frequency, hematuria, menstrual problem, pelvic pain, urgency, vaginal bleeding and vaginal discharge.  Musculoskeletal: Negative for arthralgias, back pain, myalgias and neck pain.  Skin: Negative for rash.  Allergic/Immunologic: Negative for environmental allergies and food allergies.  Neurological: Negative for dizziness, weakness,  light-headedness, numbness and headaches.  Hematological: Negative for adenopathy. Does not bruise/bleed easily.  Psychiatric/Behavioral: Negative for dysphoric mood. The patient is not nervous/anxious.     Patient Active Problem List   Diagnosis Date Noted  . Family history of colon cancer   . Polyp of ascending colon     Allergies  Allergen Reactions  . Penicillins Anaphylaxis    Past Surgical History:  Procedure Laterality Date  . COLONOSCOPY WITH PROPOFOL N/A 02/16/2019   Procedure: COLONOSCOPY WITH BIOPSY;  Surgeon: Lucilla Lame, MD;  Location: [facility];  Service: Endoscopy;  Laterality: N/A;  . MOUTH SURGERY  12/19/2017   Bone graft  . POLYPECTOMY N/A 02/16/2019   Procedure: POLYPECTOMY;  Surgeon: Lucilla Lame, MD;  Location: [facility];  Service: Endoscopy;  Laterality: N/A;    Social History   Tobacco Use  . Smoking status: Former Smoker    Packs/day: 1.00    Years: 34.00    Pack years: 34.00    Types: Cigarettes    Quit date: 12/19/2017    Years since quitting: 2.8  . Smokeless tobacco: Never Used  Vaping Use  . Vaping Use: Never used  Substance Use Topics  . Alcohol use: No    Alcohol/week: 0.0 standard drinks  . Drug use: No     Medication list has been reviewed and updated.  No outpatient medications have been marked as taking for the 10/14/20 encounter (Office Visit) with Juline Patch, MD.    [facility] 2/9 Scores 06/17/2020 05/16/2020 08/04/2019 01/16/2019  PHQ - 2 Score 0 4 0 5  PHQ- 9 Score 0 6 0 11    GAD 7 : Generalized Anxiety Score 06/17/2020 05/16/2020 08/04/2019  Nervous, Anxious, on Edge 1 3 0  Control/stop worrying 2 3 0  Worry too much - different things 2 2 0  Trouble relaxing 3 1 0  Restless 0 1 0  Easily annoyed or irritable 3 3 0  Afraid - awful might happen 0 0 0  Total GAD 7 Score 11 13 0  Anxiety Difficulty Extremely difficult Not difficult at all -    BP Readings from Last 3 Encounters:  10/14/20 120/80   06/17/20 138/70  05/16/20 130/80    Physical Exam Vitals and nursing note reviewed.  Constitutional:      General: She is not in acute distress.    Appearance: She is not diaphoretic.  HENT:     Head: Normocephalic and atraumatic.     Jaw: There is normal jaw occlusion.     Right Ear: Tympanic membrane, ear canal and external ear normal. No tenderness.     Left Ear: Tympanic membrane, ear canal and external ear normal. No tenderness.     Nose: Congestion and rhinorrhea present.     Mouth/Throat:     Lips: Pink.     Mouth: Mucous membranes are moist.     Dentition: Normal dentition.  Eyes:     General:        Right eye: No discharge.        Left eye: No discharge.     Conjunctiva/sclera: Conjunctivae normal.     Pupils: Pupils are equal, round, and reactive to light.  Neck:     Thyroid: No thyromegaly.     Vascular: No JVD.  Cardiovascular:     Rate and Rhythm: Normal rate and regular rhythm.     Heart sounds: Normal heart sounds. No murmur heard. No friction rub. No gallop.   Pulmonary:     Effort: Pulmonary effort is normal.     Breath sounds: Normal breath sounds.  Abdominal:     General: Bowel sounds are normal.     Palpations: Abdomen is soft. There is no mass.     Tenderness: There is no abdominal tenderness. There is no guarding.  Musculoskeletal:        General: Normal range of motion.     Cervical back: Normal range of motion and neck supple.  Lymphadenopathy:     Cervical: No cervical adenopathy.  Skin:    General: Skin is warm and dry.  Neurological:     Mental Status: She is alert.     Deep Tendon Reflexes: Reflexes are normal and symmetric.     Wt Readings from Last 3 Encounters:  10/14/20 149 lb (67.6 kg)  06/17/20 144 lb (65.3 kg)  05/16/20 147 lb (66.7 kg)    BP 120/80   Pulse 88   Ht 5\' 4"  (1.626 m)   Wt 149 lb (67.6 kg)   BMI 25.58 kg/m   Assessment and Plan:  1. Acute maxillary sinusitis, recurrence not specified Acute.   Persistent.  Stable.  Patient has had allergies in the past which she takes Zyrtec.  Patient began having symptoms of discomfort in the frontal and maxillary sinus area as well as a productive drainage.  We will initiate a azithromycin to 50 mg 2 today followed by 1 a day for 4 days.  And patient has been encouraged to couple this with Sudafed 15 mg Mucinex DM as needed cough and fluticasone nasal spray for sinus passage clearance. - azithromycin (ZITHROMAX) 250 MG tablet; 2 today then one a day for 4 days  Dispense: 6 tablet; Refill: 0

## 2020-11-21 DIAGNOSIS — F418 Other specified anxiety disorders: Secondary | ICD-10-CM | POA: Diagnosis not present

## 2020-12-22 ENCOUNTER — Ambulatory Visit (INDEPENDENT_AMBULATORY_CARE_PROVIDER_SITE_OTHER): Payer: BC Managed Care – PPO | Admitting: Family Medicine

## 2020-12-22 ENCOUNTER — Telehealth: Payer: Self-pay

## 2020-12-22 ENCOUNTER — Encounter: Payer: Self-pay | Admitting: Family Medicine

## 2020-12-22 ENCOUNTER — Other Ambulatory Visit: Payer: Self-pay

## 2020-12-22 VITALS — Ht 64.0 in

## 2020-12-22 DIAGNOSIS — U071 COVID-19: Secondary | ICD-10-CM

## 2020-12-22 NOTE — Progress Notes (Addendum)
Date:  12/22/2020   Name:  Kathryn Willis   DOB:  July 07, 1956   MRN:  [id number]   Chief Complaint: Covid Positive (Positive home test 5/24,  having bodyaches,sore throat, cough, headaches )  I Kathryn Willis from my office.  Connected withthis patient, Kathryn Willis, by telephoneat the patient's home.  I verified that I am speaking with the correct person using two identifiers. This visit was conducted via telephone due to the Covid-19 outbreak from my office at [facility] in [place], [place]. I discussed the limitations, risks, security and privacy concerns of performing an evaluation and management service by telephone. I also discussed with the patient that there may be a patient responsible charge related to this service. The patient expressed understanding and agreed to proceed.  Fever  This is a new problem. The current episode started in the past 7 days. The problem occurs intermittently. The problem has been waxing and waning. The maximum temperature noted was 100 to 100.9 F. Associated symptoms include congestion, coughing, muscle aches and a sore throat. Pertinent negatives include no abdominal pain, chest pain, diarrhea, ear pain, headaches, nausea, rash, urinary pain, vomiting or wheezing. She has tried acetaminophen for the symptoms.    No results found for: CREATININE, BUN, NA, K, CL, CO2 No results found for: CHOL, HDL, LDLCALC, LDLDIRECT, TRIG, CHOLHDL No results found for: TSH No results found for: HGBA1C No results found for: WBC, HGB, HCT, MCV, PLT No results found for: ALT, AST, GGT, ALKPHOS, BILITOT   Review of Systems  Constitutional: Negative.  Negative for chills, fatigue, fever and unexpected weight change.  HENT: Positive for congestion and sore throat. Negative for drooling, ear discharge, ear pain, rhinorrhea, sinus pressure and sneezing.   Eyes: Negative for photophobia, pain, discharge, redness and itching.  Respiratory: Positive for cough.  Negative for shortness of breath, wheezing and stridor.   Cardiovascular: Negative for chest pain, palpitations and leg swelling.  Gastrointestinal: Negative for abdominal pain, blood in stool, constipation, diarrhea, nausea and vomiting.  Endocrine: Negative for cold intolerance, heat intolerance, polydipsia, polyphagia and polyuria.  Genitourinary: Negative for dysuria, flank pain, frequency, hematuria, menstrual problem, pelvic pain, urgency, vaginal bleeding and vaginal discharge.  Musculoskeletal: Negative for arthralgias, back pain, myalgias and neck pain.  Skin: Negative for rash.  Allergic/Immunologic: Negative for environmental allergies and food allergies.  Neurological: Negative for dizziness, weakness, light-headedness, numbness and headaches.  Hematological: Negative for adenopathy. Does not bruise/bleed easily.  Psychiatric/Behavioral: Negative for dysphoric mood and suicidal ideas. The patient is not nervous/anxious.     Patient Active Problem List   Diagnosis Date Noted  . Family history of colon cancer   . Polyp of ascending colon     Allergies  Allergen Reactions  . Penicillins Anaphylaxis    Past Surgical History:  Procedure Laterality Date  . COLONOSCOPY WITH PROPOFOL N/A 02/16/2019   Procedure: COLONOSCOPY WITH BIOPSY;  Surgeon: Lucilla Lame, MD;  Location: [facility];  Service: Endoscopy;  Laterality: N/A;  . MOUTH SURGERY  12/19/2017   Bone graft  . POLYPECTOMY N/A 02/16/2019   Procedure: POLYPECTOMY;  Surgeon: Lucilla Lame, MD;  Location: [facility];  Service: Endoscopy;  Laterality: N/A;    Social History   Tobacco Use  . Smoking status: Former Smoker    Packs/day: 1.00    Years: 34.00    Pack years: 34.00    Types: Cigarettes    Quit date: 12/19/2017    Years since quitting:  3.0  . Smokeless tobacco: Never Used  Vaping Use  . Vaping Use: Never used  Substance Use Topics  . Alcohol use: No    Alcohol/week: 0.0 standard drinks   . Drug use: No     Medication list has been reviewed and updated.  Current Meds  Medication Sig  . FLUoxetine (PROZAC) 20 MG capsule Take 60 mg by mouth daily. cbc  . [DISCONTINUED] FLUoxetine HCl 60 MG TABS     PHQ 2/9 Scores 12/22/2020 06/17/2020 05/16/2020 08/04/2019  PHQ - 2 Score 0 0 4 0  PHQ- 9 Score 1 0 6 0    GAD 7 : Generalized Anxiety Score 12/22/2020 06/17/2020 05/16/2020 08/04/2019  Nervous, Anxious, on Edge 0 1 3 0  Control/stop worrying 0 2 3 0  Worry too much - different things 0 2 2 0  Trouble relaxing 0 3 1 0  Restless 0 0 1 0  Easily annoyed or irritable 0 3 3 0  Afraid - awful might happen 0 0 0 0  Total GAD 7 Score 0 11 13 0  Anxiety Difficulty - Extremely difficult Not difficult at all -    BP Readings from Last 3 Encounters:  10/14/20 120/80  06/17/20 138/70  05/16/20 130/80    Physical Exam Vitals and nursing note reviewed.     Wt Readings from Last 3 Encounters:  10/14/20 149 lb (67.6 kg)  06/17/20 144 lb (65.3 kg)  05/16/20 147 lb (66.7 kg)    Ht 5\' 4"  (1.626 m)   BMI 25.58 kg/m   Assessment and Plan:  1. COVID New onset.  Persistent.  Stable.  Patient has multiple concerns such as cough, myalgias, headache, sore throat, and mild shortness of breath.  Virtual visit was done with patient and she understands that there are other symptoms that may follow as well as the emergency concerns to watch out for which may require further intervention at the hospital.  Patient's been instructed to cover mouth and nose area with a mask when she is in a communal area at her house and to remain for the most part isolated from outside individuals for the next 5 to 7 days.  We discussed her risk factor is being 1 which puts her in low risk of complications and she understands that it this could change and if it does that she is to approach further medical intervention.  She has been encouraged to continue hydration along with fever control with Tylenol.   Patient has been instructed that she may call back if necessary will be glad to further evaluate as can be done with televisit medicine.  I spent 10 minutes with this patient, More than 50% of that time was spent in face to face education, counseling and care coordination.

## 2020-12-22 NOTE — Telephone Encounter (Signed)
This visit type is being conducted due to national recommendations for restrictions regarding the COVID- 19 Pandemic (e.g. social distancing) in effort to limit this patients exposure and mitigate transmission in our community. This visit type is felt to be most appropriate for this patient at this time. I connected with the patient today and received telephone consent from the patient and patient understand this consent will be good for 1 year.  KP  

## 2021-01-09 DIAGNOSIS — Z86018 Personal history of other benign neoplasm: Secondary | ICD-10-CM | POA: Diagnosis not present

## 2021-01-09 DIAGNOSIS — L578 Other skin changes due to chronic exposure to nonionizing radiation: Secondary | ICD-10-CM | POA: Diagnosis not present

## 2021-01-09 DIAGNOSIS — Z872 Personal history of diseases of the skin and subcutaneous tissue: Secondary | ICD-10-CM | POA: Diagnosis not present

## 2021-01-09 DIAGNOSIS — L298 Other pruritus: Secondary | ICD-10-CM | POA: Diagnosis not present

## 2021-01-16 DIAGNOSIS — F418 Other specified anxiety disorders: Secondary | ICD-10-CM | POA: Diagnosis not present

## 2021-02-06 ENCOUNTER — Telehealth: Payer: Self-pay | Admitting: *Deleted

## 2021-02-06 DIAGNOSIS — Z122 Encounter for screening for malignant neoplasm of respiratory organs: Secondary | ICD-10-CM

## 2021-02-06 DIAGNOSIS — Z87891 Personal history of nicotine dependence: Secondary | ICD-10-CM

## 2021-02-06 NOTE — Telephone Encounter (Signed)
Attempted to reach patient via telephone number listed in Epic re: scheduling her annual low dose lung screening ct scan. Left message for patient to call us back at [phone] to schedule.   Patient returned my call re scheduling annual lung screening scan. Former smoker, quit in 2019, 34 pack years. Scan scheduled for 02/14/21 @ 11:30 am.

## 2021-02-13 ENCOUNTER — Other Ambulatory Visit: Payer: Self-pay

## 2021-02-13 ENCOUNTER — Ambulatory Visit
Admission: RE | Admit: 2021-02-13 | Discharge: 2021-02-13 | Disposition: A | Discharge status: Home / Self Care | Payer: BC Managed Care – PPO | Source: Ambulatory Visit | Attending: Acute Care | Admitting: Acute Care

## 2021-02-13 DIAGNOSIS — Z87891 Personal history of nicotine dependence: Secondary | ICD-10-CM | POA: Diagnosis not present

## 2021-02-13 DIAGNOSIS — Z122 Encounter for screening for malignant neoplasm of respiratory organs: Secondary | ICD-10-CM | POA: Insufficient documentation

## 2021-02-15 ENCOUNTER — Other Ambulatory Visit: Payer: Self-pay | Admitting: Family Medicine

## 2021-02-15 DIAGNOSIS — Z1231 Encounter for screening mammogram for malignant neoplasm of breast: Secondary | ICD-10-CM

## 2021-02-17 ENCOUNTER — Encounter: Payer: Self-pay | Admitting: *Deleted

## 2021-02-20 ENCOUNTER — Telehealth: Payer: Self-pay | Admitting: Acute Care

## 2021-02-20 DIAGNOSIS — Z87891 Personal history of nicotine dependence: Secondary | ICD-10-CM

## 2021-02-20 NOTE — Telephone Encounter (Signed)
Burgess Estelle, RN mailed letter to pt on 02/17/21 with CT results.  I have faxed results to PCP and placed order for 1 yr f/u low dose ct.

## 2021-02-28 NOTE — Progress Notes (Signed)
Please call patient and let them  know their  low dose Ct was read as a Lung RADS 2: nodules that are benign in appearance and behavior with a very low likelihood of becoming a clinically active cancer due to size or lack of growth. Recommendation per radiology is for a repeat LDCT in 12 months. .Please let them  know we will order and schedule their  annual screening scan for 01/2022. Please let them  know there was notation of CAD on their  scan.  Please remind the patient  that this is a non-gated exam therefore degree or severity of disease  cannot be determined. Please have them  follow up with their PCP regarding potential risk factor modification, dietary therapy or pharmacologic therapy if clinically indicated. Pt.  is not  currently on statin therapy. Please place order for annual  screening scan for  01/2022 and fax results to PCP. Thanks so much.  Notation of aortic atherosclerosis>> No echo in the system. Please have them follow up with PCP.

## 2021-03-28 ENCOUNTER — Ambulatory Visit
Admission: RE | Admit: 2021-03-28 | Discharge: 2021-03-28 | Disposition: A | Discharge status: Home / Self Care | Payer: BC Managed Care – PPO | Source: Ambulatory Visit | Attending: Family Medicine | Admitting: Family Medicine

## 2021-03-28 ENCOUNTER — Other Ambulatory Visit: Payer: Self-pay

## 2021-03-28 DIAGNOSIS — Z1231 Encounter for screening mammogram for malignant neoplasm of breast: Secondary | ICD-10-CM

## 2021-03-31 DIAGNOSIS — F418 Other specified anxiety disorders: Secondary | ICD-10-CM | POA: Diagnosis not present

## 2021-06-13 DIAGNOSIS — F418 Other specified anxiety disorders: Secondary | ICD-10-CM | POA: Diagnosis not present

## 2022-01-10 ENCOUNTER — Ambulatory Visit
Admission: EM | Admit: 2022-01-10 | Discharge: 2022-01-10 | Disposition: A | Discharge status: Home / Self Care | Payer: Non-veteran care

## 2022-01-10 DIAGNOSIS — J069 Acute upper respiratory infection, unspecified: Secondary | ICD-10-CM

## 2022-01-10 NOTE — ED Provider Notes (Signed)
[facility]    CSN: [id number] Arrival date & time: 01/10/22  1628      History   Chief Complaint Chief Complaint  Patient presents with   Generalized Body Aches   Headache   Sore Throat   Cough    HPI Kathryn Willis is a 66 y.o. female.   Patient presents with increased fatigue, body aches gesturing, rhinorrhea, sore throat and nonproductive cough and intermittent generalized headache for 3 days.  Decreased appetite but tolerating fluids.  No known sick contacts.  Home COVID test negative.  Has attempted use of Mucinex which has been minimally helpful.  Denies shortness of breath, wheezing, fevers, ear pain.  History of emphysema  Past Medical History:  Diagnosis Date   Arthritis    finger   Depression    Emphysema of lung ([place])     Patient Active Problem List   Diagnosis Date Noted   Family history of colon cancer    Polyp of ascending colon     Past Surgical History:  Procedure Laterality Date   COLONOSCOPY WITH PROPOFOL N/A 02/16/2019   Procedure: COLONOSCOPY WITH BIOPSY;  Surgeon: Lucilla Lame, MD;  Location: [facility];  Service: Endoscopy;  Laterality: N/A;   MOUTH SURGERY  12/19/2017   Bone graft   POLYPECTOMY N/A 02/16/2019   Procedure: POLYPECTOMY;  Surgeon: Lucilla Lame, MD;  Location: [facility];  Service: Endoscopy;  Laterality: N/A;    OB History   No obstetric history on file.      Home Medications    Prior to Admission medications   Medication Sig Start Date End Date Taking? Authorizing Provider  FLUoxetine (PROZAC) 20 MG capsule Take 60 mg by mouth daily. cbc 09/27/20   [provider]    Family History Family History  Problem Relation Age of Onset   Breast cancer Mother 1   Breast cancer Cousin 6       mat cousin    Social History Social History   Tobacco Use   Smoking status: Former    Packs/day: 1.00    Years: 34.00    Total pack years: 34.00    Types: Cigarettes    Quit date:  12/19/2017    Years since quitting: 4.0   Smokeless tobacco: Never  Vaping Use   Vaping Use: Never used  Substance Use Topics   Alcohol use: No    Alcohol/week: 0.0 standard drinks of alcohol   Drug use: No     Allergies   Penicillins   Review of Systems Review of Systems  Constitutional:  Positive for fatigue. Negative for activity change, appetite change, chills, diaphoresis, fever and unexpected weight change.  HENT:  Positive for congestion, rhinorrhea and sore throat. Negative for dental problem, drooling, ear discharge, ear pain, facial swelling, hearing loss, mouth sores, nosebleeds, postnasal drip, sinus pressure, sinus pain, sneezing, tinnitus, trouble swallowing and voice change.   Respiratory:  Positive for cough. Negative for apnea, choking, chest tightness, shortness of breath, wheezing and stridor.   Cardiovascular: Negative.   Gastrointestinal: Negative.   Skin: Negative.   Neurological:  Positive for headaches. Negative for dizziness, tremors, seizures, syncope, facial asymmetry, speech difficulty, weakness, light-headedness and numbness.     Physical Exam Triage Vital Signs ED Triage Vitals [01/10/22 1644]  Enc Vitals Group     BP 114/61     Pulse Rate 73     Resp 18     Temp 98.6 F (37 C)  Temp Source Oral     SpO2 98 %     Weight      Height      Head Circumference      Peak Flow      Pain Score      Pain Loc      Pain Edu?      Excl. in [place]?    No data found.  Updated Vital Signs BP 114/61 (BP Location: Left Arm)   Pulse 73   Temp 98.6 F (37 C) (Oral)   Resp 18   SpO2 98%   Visual Acuity Right Eye Distance:   Left Eye Distance:   Bilateral Distance:    Right Eye Near:   Left Eye Near:    Bilateral Near:     Physical Exam Constitutional:      Appearance: Normal appearance. She is well-developed.  HENT:     Head: Normocephalic.     Right Ear: Tympanic membrane, ear canal and external ear normal.     Left Ear: Tympanic  membrane, ear canal and external ear normal.     Nose: Congestion and rhinorrhea present.     Mouth/Throat:     Mouth: Mucous membranes are moist.     Pharynx: Posterior oropharyngeal erythema present.     Tonsils: No tonsillar exudate. 0 on the right. 0 on the left.  Eyes:     Extraocular Movements: Extraocular movements intact.  Cardiovascular:     Rate and Rhythm: Normal rate and regular rhythm.     Pulses: Normal pulses.     Heart sounds: Normal heart sounds.  Pulmonary:     Effort: Pulmonary effort is normal.     Breath sounds: Normal breath sounds.  Musculoskeletal:     Cervical back: Normal range of motion.  Lymphadenopathy:     Cervical: Cervical adenopathy present.  Skin:    General: Skin is warm and dry.  Neurological:     Mental Status: She is alert and oriented to person, place, and time. Mental status is at baseline.  Psychiatric:        Mood and Affect: Mood normal.        Behavior: Behavior normal.      UC Treatments / Results  Labs (all labs ordered are listed, but only abnormal results are displayed) Labs Reviewed  GROUP A STREP BY PCR    EKG   Radiology No results found.  Procedures Procedures (including critical care time)  Medications Ordered in UC Medications - No data to display  Initial Impression / Assessment and Plan / UC Course  I have reviewed the triage vital signs and the nursing notes.  Pertinent labs & imaging results that were available during my care of the patient were reviewed by me and considered in my medical decision making (see chart for details).  Rhinorrhea with cough  Vital signs are stable, O2 saturation 98% lungs clear to patient low suspicion for pneumonia, pneumothorax or bronchitis will defer imaging at this time, etiology is most likely viral, discussed with patient, given written handout for over-the-counter medications and home remedies that she may try for additional comfort, may follow-up with this urgent care  as needed if symptoms persist or worsen Final Clinical Impressions(s) / UC Diagnoses   Final diagnoses:  None   Discharge Instructions   None    ED Prescriptions   None    PDMP not reviewed this encounter.   Hans Eden, NP 01/10/22 1701

## 2022-01-10 NOTE — Discharge Instructions (Signed)

## 2022-01-10 NOTE — ED Triage Notes (Signed)
Onset 3 days ago with sire throat,cough and congestion. Negative-home covid test. She is taking mucinex for cough.

## 2022-02-13 ENCOUNTER — Ambulatory Visit: Payer: BC Managed Care – PPO

## 2022-04-10 ENCOUNTER — Telehealth: Payer: Self-pay | Admitting: Acute Care

## 2022-04-10 NOTE — Telephone Encounter (Signed)
Pt lost previous insurance and is now on [place] coverage and states they will follow up the lung screenings from this point on.

## 2022-05-03 ENCOUNTER — Ambulatory Visit
Admission: EM | Admit: 2022-05-03 | Discharge: 2022-05-03 | Disposition: A | Discharge status: Home / Self Care | Payer: Non-veteran care | Attending: Physician Assistant | Admitting: Physician Assistant

## 2022-05-03 DIAGNOSIS — R21 Rash and other nonspecific skin eruption: Secondary | ICD-10-CM | POA: Diagnosis not present

## 2022-05-03 DIAGNOSIS — T63421A Toxic effect of venom of ants, accidental (unintentional), initial encounter: Secondary | ICD-10-CM | POA: Diagnosis not present

## 2022-05-03 DIAGNOSIS — L299 Pruritus, unspecified: Secondary | ICD-10-CM | POA: Diagnosis not present

## 2022-05-03 MED ORDER — BETAMETHASONE DIPROPIONATE 0.05 % EX OINT: TOPICAL_OINTMENT | Freq: Two times a day (BID) | CUTANEOUS | 0 refills | Status: DC

## 2022-05-03 MED ORDER — HYDROXYZINE HCL 10 MG PO TABS: ORAL_TABLET | ORAL | 1 refills | Status: DC

## 2022-05-03 MED ORDER — DEXAMETHASONE SODIUM PHOSPHATE 10 MG/ML IJ SOLN
10.0000 mg | Freq: Once | INTRAMUSCULAR | Status: AC
  Administered 2022-05-03: 10 mg via INTRAMUSCULAR

## 2022-05-03 NOTE — ED Provider Notes (Signed)
[facility]    CSN: [id number] Arrival date & time: 05/03/22  1128      History   Chief Complaint No chief complaint on file.   HPI Kathryn Willis is a 66 y.o. female presenting for rash of bilateral hands and left thigh.  Patient reports she was moving concrete blocks at home a couple days ago and they were covered in fire ants.  She says that she has numerous bites on her hands.  She has been applying ice which does help with swelling and also taking Benadryl at nighttime and using topical triamcinolone cream.  She says the itching is so intense she cannot stop itching.  She says she does not know what else to do but she is very uncomfortable.  HPI  Past Medical History:  Diagnosis Date   Arthritis    finger   Depression    Emphysema of lung ([place])     Patient Active Problem List   Diagnosis Date Noted   Family history of colon cancer    Polyp of ascending colon     Past Surgical History:  Procedure Laterality Date   COLONOSCOPY WITH PROPOFOL N/A 02/16/2019   Procedure: COLONOSCOPY WITH BIOPSY;  Surgeon: Lucilla Lame, MD;  Location: [place];  Service: Endoscopy;  Laterality: N/A;   MOUTH SURGERY  12/19/2017   Bone graft   POLYPECTOMY N/A 02/16/2019   Procedure: POLYPECTOMY;  Surgeon: Lucilla Lame, MD;  Location: [place];  Service: Endoscopy;  Laterality: N/A;    OB History   No obstetric history on file.      Home Medications    Prior to Admission medications   Medication Sig Start Date End Date Taking? Authorizing Provider  betamethasone dipropionate (DIPROLENE) 0.05 % ointment Apply topically 2 (two) times daily. 05/03/22  Yes Danton Clap, PA-C  FLUoxetine (PROZAC) 20 MG capsule Take 60 mg by mouth daily. cbc 09/27/20  Yes [provider]  hydrOXYzine (ATARAX) 10 MG tablet Take 1 to 2 tablets 3 times daily as needed for itching 05/03/22  Yes Danton Clap, PA-C    Family History Family History  Problem  Relation Age of Onset   Breast cancer Mother 26   Breast cancer Cousin 67       mat cousin    Social History Social History   Tobacco Use   Smoking status: Former    Packs/day: 1.00    Years: 34.00    Total pack years: 34.00    Types: Cigarettes    Quit date: 12/19/2017    Years since quitting: 4.3   Smokeless tobacco: Never  Vaping Use   Vaping Use: Never used  Substance Use Topics   Alcohol use: No    Alcohol/week: 0.0 standard drinks of alcohol   Drug use: No     Allergies   Penicillins   Review of Systems Review of Systems  Constitutional:  Negative for fever.  Musculoskeletal:  Negative for arthralgias and joint swelling.  Skin:  Positive for rash. Negative for wound.     Physical Exam Triage Vital Signs ED Triage Vitals  Enc Vitals Group     BP 05/03/22 1150 137/78     Pulse Rate 05/03/22 1150 81     Resp 05/03/22 1150 18     Temp 05/03/22 1150 98.9 F (37.2 C)     Temp Source 05/03/22 1150 Oral     SpO2 05/03/22 1150 94 %     Weight 05/03/22 1148  147 lb (66.7 kg)     Height 05/03/22 1148 '5\' 4"'$  (1.626 m)     Head Circumference --      Peak Flow --      Pain Score 05/03/22 1148 10     Pain Loc --      Pain Edu? --      Excl. in [place]? --    No data found.  Updated Vital Signs BP 137/78 (BP Location: Left Arm)   Pulse 81   Temp 98.9 F (37.2 C) (Oral)   Resp 18   Ht '5\' 4"'$  (1.626 m)   Wt 147 lb (66.7 kg)   SpO2 94%   BMI 25.23 kg/m     Physical Exam Vitals and nursing note reviewed.  Constitutional:      General: She is not in acute distress.    Appearance: Normal appearance. She is not ill-appearing or toxic-appearing.  HENT:     Head: Normocephalic and atraumatic.  Eyes:     General: No scleral icterus.       Right eye: No discharge.        Left eye: No discharge.     Conjunctiva/sclera: Conjunctivae normal.  Cardiovascular:     Rate and Rhythm: Normal rate and regular rhythm.  Pulmonary:     Effort: Pulmonary effort is normal.  No respiratory distress.  Musculoskeletal:     Cervical back: Neck supple.  Skin:    General: Skin is dry.     Findings: Rash present.     Comments: Multiple erythematous papules on hands and 1 large area on the left lateral thigh.  Patient was vigorously scratching.  Neurological:     General: No focal deficit present.     Mental Status: She is alert. Mental status is at baseline.     Motor: No weakness.     Gait: Gait normal.  Psychiatric:        Mood and Affect: Mood normal.        Behavior: Behavior normal.        Thought Content: Thought content normal.      UC Treatments / Results  Labs (all labs ordered are listed, but only abnormal results are displayed) Labs Reviewed - No data to display  EKG   Radiology No results found.  Procedures Procedures (including critical care time)  Medications Ordered in UC Medications  dexamethasone (DECADRON) injection 10 mg (10 mg Intramuscular Given 05/03/22 1223)    Initial Impression / Assessment and Plan / UC Course  I have reviewed the triage vital signs and the nursing notes.  Pertinent labs & imaging results that were available during my care of the patient were reviewed by me and considered in my medical decision making (see chart for details).   66 year old female presenting for fire ant bites to hands and left thigh that occurred a couple days ago.  She reports symptoms are not getting better despite using topical triamcinolone, ice and Benadryl.  On exam she does have numerous erythematous papules of hands and left thigh.  She is vigorously scratching.  We discussed treating with systemic corticosteroids and daily antihistamines.  She is agreeable.  She would prefer to do an injection over oral medication so patient was given 10 mg IM dexamethasone.  Advise stopping triamcinolone and starting the betamethasone.  Sent hydroxyzine for during the day and advised to continue ice and Benadryl at night.  Reviewed return  precautions.   Final Clinical Impressions(s) / UC Diagnoses  Final diagnoses:  Rash and nonspecific skin eruption  Fire ant bite, accidental or unintentional, initial encounter  Pruritic condition     Discharge Instructions      -Take hydroxyzine during the day as needed for itching and 100 scratch.  May continue Benadryl at night - May continue cool compresses and ice. - We have given you injection with steroid in clinic today.  I have also switched your topical steroid. - Should be improving over the next few days.  Return as needed.     ED Prescriptions     Medication Sig Dispense Auth. Provider   hydrOXYzine (ATARAX) 10 MG tablet Take 1 to 2 tablets 3 times daily as needed for itching 30 tablet Laurene Footman B, PA-C   betamethasone dipropionate (DIPROLENE) 0.05 % ointment Apply topically 2 (two) times daily. 30 g Gretta Cool      PDMP not reviewed this encounter.   Danton Clap, PA-C 05/03/22 1234

## 2022-05-03 NOTE — ED Triage Notes (Addendum)
Pt c/o red ant bites to both hand x2days  Pt was picking up some concrete blocks at home and did not notice that it was covered with ants.   Pt has tried OTC benadryl, Triamcinolone Acetonide 0.1%  Pt states that new bumps are appearing along her hands and that her eyes have started to itch.   Pt states that the bumps were originally on the left hand but during the night the right hand started to get bumps as well. Pt denies any ant bites to the right hand.

## 2022-05-03 NOTE — Discharge Instructions (Signed)
-  Take hydroxyzine during the day as needed for itching and 100 scratch.  May continue Benadryl at night - May continue cool compresses and ice. - We have given you injection with steroid in clinic today.  I have also switched your topical steroid. - Should be improving over the next few days.  Return as needed.

## 2022-07-06 ENCOUNTER — Encounter: Payer: Self-pay | Admitting: Emergency Medicine

## 2022-07-06 ENCOUNTER — Ambulatory Visit
Admission: EM | Admit: 2022-07-06 | Discharge: 2022-07-06 | Disposition: A | Discharge status: Home / Self Care | Payer: No Typology Code available for payment source

## 2022-07-06 DIAGNOSIS — J438 Other emphysema: Secondary | ICD-10-CM | POA: Diagnosis not present

## 2022-07-06 DIAGNOSIS — R051 Acute cough: Secondary | ICD-10-CM | POA: Diagnosis not present

## 2022-07-06 DIAGNOSIS — J019 Acute sinusitis, unspecified: Secondary | ICD-10-CM | POA: Diagnosis not present

## 2022-07-06 MED ORDER — DOXYCYCLINE HYCLATE 100 MG PO CAPS: 100.0000 mg | ORAL_CAPSULE | Freq: Two times a day (BID) | ORAL | 0 refills | Status: AC

## 2022-07-06 NOTE — Discharge Instructions (Addendum)
-  Possible sinus infection/exacerbation of your COPD.  Have sent antibiotics to pharmacy.  Start Mucinex increase rest and fluids. - Return if fever or any shortness of breath.

## 2022-07-06 NOTE — ED Triage Notes (Signed)
Patient c/o cough, chest congestion, sinus pressure for over a week.  Patient denies fevers.

## 2022-07-06 NOTE — ED Provider Notes (Signed)
[facility]    CSN: [id number] Arrival date & time: 07/06/22  1108      History   Chief Complaint Chief Complaint  Patient presents with   Cough    HPI Kathryn Willis is a 66 y.o. female presenting for cough, sinus pain/pressure, nasal congestion for greater than 1 week.  She reports that her symptoms have recently gotten a little worse.  Denies any associated fever.  Does not think she has had any increased shortness of breath from baseline.  Has a history of emphysema.  Not reporting using inhaler more often than normal.  She says that her child has had similar symptoms but seems to be better.  She denies any known exposure to flu or COVID.  Has been taking over-the-counter Sudafed but says it has not helped.  No other complaints.  HPI  Past Medical History:  Diagnosis Date   Arthritis    finger   Depression    Emphysema of lung ([place])     Patient Active Problem List   Diagnosis Date Noted   Family history of colon cancer    Polyp of ascending colon     Past Surgical History:  Procedure Laterality Date   COLONOSCOPY WITH PROPOFOL N/A 02/16/2019   Procedure: COLONOSCOPY WITH BIOPSY;  Surgeon: Lucilla Lame, MD;  Location: [place];  Service: Endoscopy;  Laterality: N/A;   MOUTH SURGERY  12/19/2017   Bone graft   POLYPECTOMY N/A 02/16/2019   Procedure: POLYPECTOMY;  Surgeon: Lucilla Lame, MD;  Location: [place];  Service: Endoscopy;  Laterality: N/A;    OB History   No obstetric history on file.      Home Medications    Prior to Admission medications   Medication Sig Start Date End Date Taking? Authorizing Provider  atorvastatin (LIPITOR) 40 MG tablet TAKE ONE-HALF TABLET BY MOUTH AT BEDTIME FOR HIGH CHOLESTEROL 09/24/21  Yes [provider]  doxycycline (VIBRAMYCIN) 100 MG capsule Take 1 capsule (100 mg total) by mouth 2 (two) times daily for 7 days. 07/06/22 07/13/22 Yes Danton Clap, PA-C  FLUoxetine (PROZAC) 20  MG capsule Take 60 mg by mouth daily. cbc 09/27/20  Yes [provider]  pantoprazole (PROTONIX) 20 MG tablet TAKE ONE TABLET BY MOUTH TWO TIMES A DAY FOR GASTROESOPHAGEAL REFLUX DISEASE 01/23/22  Yes [provider]  betamethasone dipropionate (DIPROLENE) 0.05 % ointment Apply topically 2 (two) times daily. 05/03/22   Danton Clap, PA-C  hydrOXYzine (ATARAX) 10 MG tablet Take 1 to 2 tablets 3 times daily as needed for itching 05/03/22   Danton Clap, PA-C    Family History Family History  Problem Relation Age of Onset   Breast cancer Mother 31   Breast cancer Cousin 46       mat cousin    Social History Social History   Tobacco Use   Smoking status: Former    Packs/day: 1.00    Years: 34.00    Total pack years: 34.00    Types: Cigarettes    Quit date: 12/19/2017    Years since quitting: 4.5   Smokeless tobacco: Never  Vaping Use   Vaping Use: Never used  Substance Use Topics   Alcohol use: No    Alcohol/week: 0.0 standard drinks of alcohol   Drug use: No     Allergies   Penicillins   Review of Systems Review of Systems  Constitutional:  Positive for fatigue. Negative for chills, diaphoresis and fever.  HENT:  Positive for congestion, rhinorrhea, sinus pressure and sinus pain. Negative for ear pain and sore throat.   Respiratory:  Positive for cough. Negative for shortness of breath.   Gastrointestinal:  Negative for abdominal pain, nausea and vomiting.  Musculoskeletal:  Negative for arthralgias and myalgias.  Skin:  Negative for rash.  Neurological:  Positive for headaches. Negative for weakness.  Hematological:  Negative for adenopathy.     Physical Exam Triage Vital Signs ED Triage Vitals  Enc Vitals Group     BP 07/06/22 1225 (!) 113/101     Pulse Rate 07/06/22 1225 79     Resp 07/06/22 1225 14     Temp 07/06/22 1225 97.8 F (36.6 C)     Temp Source 07/06/22 1225 Oral     SpO2 07/06/22 1225 97 %     Weight 07/06/22 1222 150 lb (68  kg)     Height 07/06/22 1222 '5\' 4"'$  (1.626 m)     Head Circumference --      Peak Flow --      Pain Score 07/06/22 1222 8     Pain Loc --      Pain Edu? --      Excl. in [place]? --    No data found.  Updated Vital Signs BP (!) 113/101 (BP Location: Left Arm)   Pulse 79   Temp 97.8 F (36.6 C) (Oral)   Resp 14   Ht '5\' 4"'$  (1.626 m)   Wt 150 lb (68 kg)   SpO2 97%   BMI 25.75 kg/m    Physical Exam Vitals and nursing note reviewed.  Constitutional:      General: She is not in acute distress.    Appearance: Normal appearance. She is not ill-appearing or toxic-appearing.  HENT:     Head: Normocephalic and atraumatic.     Right Ear: Tympanic membrane, ear canal and external ear normal.     Left Ear: Tympanic membrane, ear canal and external ear normal.     Nose: Congestion present.     Mouth/Throat:     Mouth: Mucous membranes are moist.     Pharynx: Oropharynx is clear.  Eyes:     General: No scleral icterus.       Right eye: No discharge.        Left eye: No discharge.     Conjunctiva/sclera: Conjunctivae normal.  Cardiovascular:     Rate and Rhythm: Normal rate and regular rhythm.     Heart sounds: Normal heart sounds.  Pulmonary:     Effort: Pulmonary effort is normal. No respiratory distress.     Breath sounds: Normal breath sounds.  Musculoskeletal:     Cervical back: Neck supple.  Skin:    General: Skin is dry.  Neurological:     General: No focal deficit present.     Mental Status: She is alert. Mental status is at baseline.     Motor: No weakness.     Gait: Gait normal.  Psychiatric:        Mood and Affect: Mood normal.        Behavior: Behavior normal.        Thought Content: Thought content normal.      UC Treatments / Results  Labs (all labs ordered are listed, but only abnormal results are displayed) Labs Reviewed - No data to display  EKG   Radiology No results found.  Procedures Procedures (including critical care time)  Medications  Ordered in UC  Medications - No data to display  Initial Impression / Assessment and Plan / UC Course  I have reviewed the triage vital signs and the nursing notes.  Pertinent labs & imaging results that were available during my care of the patient were reviewed by me and considered in my medical decision making (see chart for details).   66 year old female presents for cough, congestion, sinus pain/pressure x 1 week, recently worsening.  No fever or breathing difficulty.  History of emphysema.  She is afebrile and overall well-appearing.  No acute distress.  On exam she has nasal congestion.  Throat is clear.  Chest clear auscultation heart regular rate and rhythm.  Suspect viral illness, possible secondary bacterial sinusitis or early COPD exacerbation.  Will cover at this time with doxycycline since she has penicillin allergy.  Advised starting Mucinex and plenty of rest and fluids, nasal sprays.  Reviewed return and ER precautions.   Final Clinical Impressions(s) / UC Diagnoses   Final diagnoses:  Acute sinusitis, recurrence not specified, unspecified location  Acute cough  Other emphysema ([facility])     Discharge Instructions      -Possible sinus infection/exacerbation of your COPD.  Have sent antibiotics to pharmacy.  Start Mucinex increase rest and fluids. - Return if fever or any shortness of breath.     ED Prescriptions     Medication Sig Dispense Auth. Provider   doxycycline (VIBRAMYCIN) 100 MG capsule Take 1 capsule (100 mg total) by mouth 2 (two) times daily for 7 days. 14 capsule Danton Clap, PA-C      PDMP not reviewed this encounter.   Danton Clap, PA-C 07/06/22 1310

## 2023-04-30 ENCOUNTER — Ambulatory Visit
Admission: EM | Admit: 2023-04-30 | Discharge: 2023-04-30 | Disposition: A | Discharge status: Home / Self Care | Payer: No Typology Code available for payment source | Attending: Emergency Medicine | Admitting: Emergency Medicine

## 2023-04-30 DIAGNOSIS — J029 Acute pharyngitis, unspecified: Secondary | ICD-10-CM | POA: Diagnosis present

## 2023-04-30 DIAGNOSIS — K219 Gastro-esophageal reflux disease without esophagitis: Secondary | ICD-10-CM | POA: Diagnosis not present

## 2023-04-30 LAB — GROUP A STREP BY PCR: Group A Strep by PCR: NOT DETECTED

## 2023-04-30 NOTE — ED Triage Notes (Signed)
Sore throat x 3 weeks

## 2023-04-30 NOTE — Discharge Instructions (Signed)
Strep test was negative, take home meds as directed, avoid spicy greasy fried foods.  Most likely your sore throat is coming from reflux.  Follow-up with PCP, return as needed

## 2023-04-30 NOTE — ED Provider Notes (Signed)
[facility]    CSN: [id number] Arrival date & time: 04/30/23  1919      History   Chief Complaint Chief Complaint  Patient presents with   Sore Throat    HPI Kathryn Willis is a 67 y.o. female.   67 year old female, Kathryn Willis, presents to urgent care for evaluation of sore throat x 3 weeks.  Patient states she ate spicy foods, is taking meds for reflux.  Patient denies alcohol or smoking.  The history is provided by the patient. No language interpreter was used.    Past Medical History:  Diagnosis Date   Arthritis    finger   Depression    Emphysema of lung ([facility])     Patient Active Problem List   Diagnosis Date Noted   Acute pharyngitis 04/30/2023   Gastroesophageal reflux disease 04/30/2023   Family history of colon cancer    Polyp of ascending colon     Past Surgical History:  Procedure Laterality Date   COLONOSCOPY WITH PROPOFOL N/A 02/16/2019   Procedure: COLONOSCOPY WITH BIOPSY;  Surgeon: Midge Minium, MD;  Location: [facility];  Service: Endoscopy;  Laterality: N/A;   MOUTH SURGERY  12/19/2017   Bone graft   POLYPECTOMY N/A 02/16/2019   Procedure: POLYPECTOMY;  Surgeon: Midge Minium, MD;  Location: [facility];  Service: Endoscopy;  Laterality: N/A;    OB History   No obstetric history on file.      Home Medications    Prior to Admission medications   Medication Sig Start Date End Date Taking? Authorizing Provider  atorvastatin (LIPITOR) 40 MG tablet TAKE ONE-HALF TABLET BY MOUTH AT BEDTIME FOR HIGH CHOLESTEROL 09/24/21   [provider]  betamethasone dipropionate (DIPROLENE) 0.05 % ointment Apply topically 2 (two) times daily. 05/03/22   Shirlee Latch, PA-C  FLUoxetine (PROZAC) 20 MG capsule Take 60 mg by mouth daily. cbc 09/27/20   [provider]  hydrOXYzine (ATARAX) 10 MG tablet Take 1 to 2 tablets 3 times daily as needed for itching 05/03/22   Eusebio Friendly B, PA-C  pantoprazole (PROTONIX) 20  MG tablet TAKE ONE TABLET BY MOUTH TWO TIMES A DAY FOR GASTROESOPHAGEAL REFLUX DISEASE 01/23/22   [provider]    Family History Family History  Problem Relation Age of Onset   Breast cancer Mother 79   Breast cancer Cousin 52       mat cousin    Social History Social History   Tobacco Use   Smoking status: Former    Current packs/day: 0.00    Average packs/day: 1 pack/day for 34.0 years (34.0 ttl pk-yrs)    Types: Cigarettes    Start date: 12/20/1983    Quit date: 12/19/2017    Years since quitting: 5.3   Smokeless tobacco: Never  Vaping Use   Vaping status: Never Used  Substance Use Topics   Alcohol use: No    Alcohol/week: 0.0 standard drinks of alcohol   Drug use: No     Allergies   Penicillins   Review of Systems Review of Systems  Constitutional:  Negative for fever.  HENT:  Positive for sore throat.   All other systems reviewed and are negative.    Physical Exam Triage Vital Signs ED Triage Vitals  Encounter Vitals Group     BP 04/30/23 1928 (!) 151/78     Systolic BP Percentile --      Diastolic BP Percentile --      Pulse Rate 04/30/23  1928 78     Resp 04/30/23 1928 18     Temp 04/30/23 1928 98.5 F (36.9 C)     Temp Source 04/30/23 1928 Oral     SpO2 04/30/23 1928 96 %     Weight --      Height --      Head Circumference --      Peak Flow --      Pain Score 04/30/23 1927 7     Pain Loc --      Pain Education --      Exclude from Growth Chart --    No data found.  Updated Vital Signs BP (!) 151/78 (BP Location: Right Arm)   Pulse 78   Temp 98.5 F (36.9 C) (Oral)   Resp 18   SpO2 96%   Visual Acuity Right Eye Distance:   Left Eye Distance:   Bilateral Distance:    Right Eye Near:   Left Eye Near:    Bilateral Near:     Physical Exam Vitals and nursing note reviewed.  HENT:     Head: Normocephalic.     Right Ear: Tympanic membrane normal.     Left Ear: Tympanic membrane normal.     Nose: Nose normal.      Mouth/Throat:     Lips: Pink.     Mouth: Mucous membranes are moist.     Pharynx: Oropharynx is clear.  Cardiovascular:     Rate and Rhythm: Normal rate and regular rhythm.     Pulses: Normal pulses.     Heart sounds: Normal heart sounds.  Pulmonary:     Effort: Pulmonary effort is normal.     Breath sounds: Normal breath sounds and air entry.  Neurological:     General: No focal deficit present.     Mental Status: She is alert and oriented to person, place, and time.     GCS: GCS eye subscore is 4. GCS verbal subscore is 5. GCS motor subscore is 6.  Psychiatric:        Attention and Perception: Attention normal.        Mood and Affect: Mood normal.        Speech: Speech normal.        Behavior: Behavior normal.      UC Treatments / Results  Labs (all labs ordered are listed, but only abnormal results are displayed) Labs Reviewed  GROUP A STREP BY PCR    EKG   Radiology No results found.  Procedures Procedures (including critical care time)  Medications Ordered in UC Medications - No data to display  Initial Impression / Assessment and Plan / UC Course  I have reviewed the triage vital signs and the nursing notes.  Pertinent labs & imaging results that were available during my care of the patient were reviewed by me and considered in my medical decision making (see chart for details).     Ddx: Pharyngitis, GERD Final Clinical Impressions(s) / UC Diagnoses   Final diagnoses:  Acute pharyngitis, unspecified etiology  Gastroesophageal reflux disease, unspecified whether esophagitis present     Discharge Instructions      Strep test was negative, take home meds as directed, avoid spicy greasy fried foods.  Most likely your sore throat is coming from reflux.  Follow-up with PCP, return as needed     ED Prescriptions   None    PDMP not reviewed this encounter.   Clancy Gourd, NP 04/30/23 1956

## 2023-08-06 ENCOUNTER — Encounter: Payer: Self-pay | Admitting: Ophthalmology

## 2023-08-14 NOTE — Discharge Instructions (Signed)

## 2023-08-16 ENCOUNTER — Encounter
Admission: RE | Disposition: A | Discharge status: Home / Self Care | Payer: Self-pay | Source: Home / Self Care | Attending: Ophthalmology

## 2023-08-16 ENCOUNTER — Ambulatory Visit
Admission: RE | Admit: 2023-08-16 | Discharge: 2023-08-16 | Disposition: A | Discharge status: Home / Self Care | Payer: No Typology Code available for payment source | Attending: Ophthalmology | Admitting: Ophthalmology

## 2023-08-16 ENCOUNTER — Other Ambulatory Visit: Payer: Self-pay

## 2023-08-16 ENCOUNTER — Ambulatory Visit: Payer: No Typology Code available for payment source | Admitting: General Practice

## 2023-08-16 ENCOUNTER — Encounter: Payer: Self-pay | Admitting: Ophthalmology

## 2023-08-16 DIAGNOSIS — H02834 Dermatochalasis of left upper eyelid: Secondary | ICD-10-CM | POA: Insufficient documentation

## 2023-08-16 DIAGNOSIS — F32A Depression, unspecified: Secondary | ICD-10-CM | POA: Insufficient documentation

## 2023-08-16 DIAGNOSIS — Z87891 Personal history of nicotine dependence: Secondary | ICD-10-CM | POA: Diagnosis not present

## 2023-08-16 DIAGNOSIS — K219 Gastro-esophageal reflux disease without esophagitis: Secondary | ICD-10-CM | POA: Diagnosis not present

## 2023-08-16 DIAGNOSIS — J449 Chronic obstructive pulmonary disease, unspecified: Secondary | ICD-10-CM | POA: Insufficient documentation

## 2023-08-16 DIAGNOSIS — H02831 Dermatochalasis of right upper eyelid: Secondary | ICD-10-CM | POA: Diagnosis present

## 2023-08-16 HISTORY — PX: BROW LIFT: SHX178

## 2023-08-16 SURGERY — BLEPHAROPLASTY
Anesthesia: General | Laterality: Bilateral

## 2023-08-16 MED ORDER — ERYTHROMYCIN 5 MG/GM OP OINT: TOPICAL_OINTMENT | OPHTHALMIC | 2 refills | Status: AC

## 2023-08-16 MED ORDER — MIDAZOLAM HCL 2 MG/2ML IJ SOLN
INTRAMUSCULAR | Status: AC
  Filled 2023-08-16: qty 2

## 2023-08-16 MED ORDER — ONDANSETRON HCL 4 MG/2ML IJ SOLN
INTRAMUSCULAR | Status: DC | PRN
  Administered 2023-08-16: 4 mg via INTRAVENOUS

## 2023-08-16 MED ORDER — ERYTHROMYCIN 5 MG/GM OP OINT
TOPICAL_OINTMENT | OPHTHALMIC | Status: DC | PRN
  Administered 2023-08-16: 1 via OPHTHALMIC

## 2023-08-16 MED ORDER — LIDOCAINE-EPINEPHRINE 2 %-1:100000 IJ SOLN
INTRAMUSCULAR | Status: DC | PRN
  Administered 2023-08-16: 2.2 mL via OPHTHALMIC

## 2023-08-16 MED ORDER — ONDANSETRON HCL 4 MG/2ML IJ SOLN
INTRAMUSCULAR | Status: AC
  Filled 2023-08-16: qty 2

## 2023-08-16 MED ORDER — FENTANYL CITRATE (PF) 100 MCG/2ML IJ SOLN
INTRAMUSCULAR | Status: DC | PRN
  Administered 2023-08-16 (×2): 25 ug via INTRAVENOUS

## 2023-08-16 MED ORDER — LACTATED RINGERS IV SOLN: INTRAVENOUS | Status: DC

## 2023-08-16 MED ORDER — LIDOCAINE HCL (CARDIAC) PF 100 MG/5ML IV SOSY
PREFILLED_SYRINGE | INTRAVENOUS | Status: DC | PRN
  Administered 2023-08-16: 60 mg via INTRAVENOUS

## 2023-08-16 MED ORDER — TRAMADOL HCL 50 MG PO TABS: ORAL_TABLET | ORAL | 0 refills | Status: AC

## 2023-08-16 MED ORDER — PROPOFOL 10 MG/ML IV BOLUS
INTRAVENOUS | Status: AC
  Filled 2023-08-16: qty 20

## 2023-08-16 MED ORDER — FENTANYL CITRATE (PF) 100 MCG/2ML IJ SOLN
INTRAMUSCULAR | Status: AC
  Filled 2023-08-16: qty 2

## 2023-08-16 MED ORDER — LIDOCAINE HCL (PF) 2 % IJ SOLN
INTRAMUSCULAR | Status: AC
  Filled 2023-08-16: qty 5

## 2023-08-16 MED ORDER — MIDAZOLAM HCL 2 MG/2ML IJ SOLN
INTRAMUSCULAR | Status: DC | PRN
  Administered 2023-08-16 (×2): 1 mg via INTRAVENOUS

## 2023-08-16 MED ORDER — BSS IO SOLN
INTRAOCULAR | Status: DC | PRN
  Administered 2023-08-16: 15 mL

## 2023-08-16 MED ORDER — TETRACAINE HCL 0.5 % OP SOLN
OPHTHALMIC | Status: DC | PRN
  Administered 2023-08-16: 2 [drp] via OPHTHALMIC

## 2023-08-16 MED ORDER — PROPOFOL 500 MG/50ML IV EMUL
INTRAVENOUS | Status: DC | PRN
  Administered 2023-08-16: 75 ug/kg/min via INTRAVENOUS
  Administered 2023-08-16: 20 mg via INTRAVENOUS

## 2023-08-16 SURGICAL SUPPLY — 19 items
APPLICATOR COTTON TIP WD 3 STR (MISCELLANEOUS) ×1 IMPLANT
BLADE SURG 15 STRL LF DISP TIS (BLADE) ×1 IMPLANT
CORD BIP STRL DISP 12FT (MISCELLANEOUS) IMPLANT
GAUZE SPONGE 2X2 STRL 8-PLY (GAUZE/BANDAGES/DRESSINGS) ×10 IMPLANT
GAUZE SPONGE 4X4 12PLY STRL (GAUZE/BANDAGES/DRESSINGS) ×1 IMPLANT
GLOVE SURG UNDER POLY LF SZ7 (GLOVE) ×2 IMPLANT
GOWN STRL REUS W/ TWL LRG LVL3 (GOWN DISPOSABLE) ×1 IMPLANT
MARKER SKIN XFINE TIP W/RULER (MISCELLANEOUS) ×1 IMPLANT
NDL FILTER BLUNT 18X1 1/2 (NEEDLE) ×1 IMPLANT
NDL HYPO 30X.5 LL (NEEDLE) ×2 IMPLANT
NEEDLE FILTER BLUNT 18X1 1/2 (NEEDLE) ×1
NEEDLE HYPO 30X.5 LL (NEEDLE) ×2
PACK ENT CUSTOM (PACKS) ×1 IMPLANT
SOL PREP PVP 2OZ (MISCELLANEOUS) ×1
SOLUTION PREP PVP 2OZ (MISCELLANEOUS) ×1 IMPLANT
SUT GUT PLAIN 6-0 1X18 ABS (SUTURE) ×1 IMPLANT
SYR 10ML LL (SYRINGE) ×1 IMPLANT
SYR 3ML LL SCALE MARK (SYRINGE) ×1 IMPLANT
WATER STERILE IRR 250ML POUR (IV SOLUTION) ×1 IMPLANT

## 2023-08-16 NOTE — Op Note (Signed)
Preoperative Diagnosis:  Visually significant dermatochalasis bilateral  Upper Eyelid(s)  Postoperative Diagnosis:  Same.  Procedure(s) Performed:   Upper eyelid blepharoplasty with excess skin excision  bilateral  Upper Eyelid(s)  Surgeon: Hubbard Robinson. Ether Griffins, M.D.  Assistants: none  Anesthesia: MAC  Specimens: None.  Estimated Blood Loss: Minimal.  Complications: None.  Operative Findings: None Dictated  Procedure:   Allergies were reviewed and the patient is allergic to Penicillins.   After the risks, benefits, complications and alternatives were discussed with the patient, appropriate informed consent was obtained and the patient was brought to the operating suite. The patient was reclined supine and a timeout was conducted.  The patient was then sedated.  Local anesthetic consisting of a 50-50 mixture of 2% lidocaine with epinephrine and 0.75% bupivacaine with added Hylenex was injected subcutaneously to both  upper eyelid(s). After adequate local was instilled, the patient was prepped and draped in the usual sterile fashion for eyelid surgery.   Attention was turned to the upper eyelids. A 9mm upper eyelid crease incision line was marked with calipers on both  upper eyelid(s).  A pinch test was used to estimate the amount of excess skin to remove and this was marked in standard blepharoplasty style fashion. Attention was turned to the  right  upper eyelid. A #15 blade was used to open the premarked incision line. A Skin and muscle flap was excised and hemostasis was obtained with bipolar cautery.   Attention was then turned to the opposite eyelid where the same procedure was performed in the same manner. Hemostasis was obtained with bipolar cautery throughout. All incisions were then closed with a combination of running and interrupted 6-0 fast absorbing plain suture. The patient tolerated the procedure well.  Erythromycin ophthalmic ointment was applied to her incision sites,  followed by ice packs. She was taken to the recovery area where she recovered without difficulty.  Post-Op Plan/Instructions:  The patient was instructed to use ice packs frequently for the next 48 hours. She was instructed to use Erythromycin ophthalmic ointment on her incisions 4 times a day for the next 12 to 14 days. She was given a prescription for tramadol (or similar) for pain control should Tylenol not be effective. She was asked to to follow up in 2-3 weeks' time at the [facility] in [place], [place] or sooner as needed for problems.  Deionna Marcantonio M. Ether Griffins, M.D. Ophthalmology

## 2023-08-16 NOTE — Anesthesia Preprocedure Evaluation (Signed)
Anesthesia Evaluation  Patient identified by MRN, date of birth, ID band Patient awake    Reviewed: Allergy & Precautions, H&P , NPO status , Patient's Chart, lab work & pertinent test results, reviewed documented beta blocker date and time   Airway Mallampati: II   Neck ROM: full    Dental  (+) Poor Dentition   Pulmonary COPD, former smoker   Pulmonary exam normal        Cardiovascular negative cardio ROS Normal cardiovascular exam Rhythm:regular Rate:Normal     Neuro/Psych  PSYCHIATRIC DISORDERS  Depression    negative neurological ROS     GI/Hepatic Neg liver ROS,GERD  Medicated,,  Endo/Other  negative endocrine ROS    Renal/GU negative Renal ROS  negative genitourinary   Musculoskeletal   Abdominal   Peds  Hematology negative hematology ROS (+)   Anesthesia Other Findings Past Medical History: No date: Arthritis     Comment:  finger No date: Depression No date: Emphysema of lung (HCC) Past Surgical History: 02/16/2019: COLONOSCOPY WITH PROPOFOL; N/A     Comment:  Procedure: COLONOSCOPY WITH BIOPSY;  Surgeon: Midge Minium, MD;  Location: [facility];  Service:               Endoscopy;  Laterality: N/A; 12/19/2017: MOUTH SURGERY     Comment:  Bone graft 02/16/2019: POLYPECTOMY; N/A     Comment:  Procedure: POLYPECTOMY;  Surgeon: Midge Minium, MD;                Location: [facility];  Service: Endoscopy;                Laterality: N/A; BMI    Body Mass Index: 25.75 kg/m     Reproductive/Obstetrics negative OB ROS                             Anesthesia Physical Anesthesia Plan  ASA: 3  Anesthesia Plan: General   Post-op Pain Management:    Induction:   PONV Risk Score and Plan:   Airway Management Planned:   Additional Equipment:   Intra-op Plan:   Post-operative Plan:   Informed Consent: I have reviewed the patients History and  Physical, chart, labs and discussed the procedure including the risks, benefits and alternatives for the proposed anesthesia with the patient or authorized representative who has indicated his/her understanding and acceptance.     Dental Advisory Given  Plan Discussed with: CRNA  Anesthesia Plan Comments:        Anesthesia Quick Evaluation

## 2023-08-16 NOTE — Transfer of Care (Signed)
Immediate Anesthesia Transfer of Care Note  Patient: Kathryn Willis  Procedure(s) Performed: BLEPHAROPLASTY UPPER EYELID; W/EXCESS SKIN BILATERAL (Bilateral)  Patient Location: PACU  Anesthesia Type: General  Level of Consciousness: awake, alert  and patient cooperative  Airway and Oxygen Therapy: Patient Spontanous Breathing and Patient connected to supplemental oxygen  Post-op Assessment: Post-op Vital signs reviewed, Patient's Cardiovascular Status Stable, Respiratory Function Stable, Patent Airway and No signs of Nausea or vomiting  Post-op Vital Signs: Reviewed and stable  Complications: No notable events documented.

## 2023-08-16 NOTE — Interval H&P Note (Signed)
History and Physical Interval Note:  08/16/2023 11:17 AM  Kathryn Willis  has presented today for surgery, with the diagnosis of H02.831 Dermatochalasis of Right Upper Eyelid H02.834 Dermatochalasis of Left Upper Eyelid.  The various methods of treatment have been discussed with the patient and family. After consideration of risks, benefits and other options for treatment, the patient has consented to  Procedure(s): BLEPHAROPLASTY UPPER EYELID; W/EXCESS SKIN BILATERAL (Bilateral) as a surgical intervention.  The patient's history has been reviewed, patient examined, no change in status, stable for surgery.  I have reviewed the patient's chart and labs.  Questions were answered to the patient's satisfaction.     Ether Griffins, Philippe Gang M

## 2023-08-16 NOTE — H&P (Signed)
[facility]: [facility]  Primary Care Physician:  [facility] Ophthalmologist: Dr. Hubbard Robinson. Ether Griffins, M.D.  Pre-Procedure History & Physical: HPI:  Kathryn Willis is a 68 y.o. female here for periocular surgery.   Past Medical History:  Diagnosis Date   Arthritis    finger   Depression    Emphysema of lung (HCC)     Past Surgical History:  Procedure Laterality Date   COLONOSCOPY WITH PROPOFOL N/A 02/16/2019   Procedure: COLONOSCOPY WITH BIOPSY;  Surgeon: Midge Minium, MD;  Location: [facility];  Service: Endoscopy;  Laterality: N/A;   MOUTH SURGERY  12/19/2017   Bone graft   POLYPECTOMY N/A 02/16/2019   Procedure: POLYPECTOMY;  Surgeon: Midge Minium, MD;  Location: [facility];  Service: Endoscopy;  Laterality: N/A;    Prior to Admission medications   Medication Sig Start Date End Date Taking? Authorizing Provider  atorvastatin (LIPITOR) 40 MG tablet TAKE ONE-HALF TABLET BY MOUTH AT BEDTIME FOR HIGH CHOLESTEROL 09/24/21  Yes [provider]  calcium-vitamin D (OSCAL WITH D) 500-5 MG-MCG tablet Take 1 tablet by mouth.   Yes [provider]  famotidine (PEPCID) 20 MG tablet Take 20 mg by mouth 2 (two) times daily.   Yes [provider]  FLUoxetine (PROZAC) 20 MG capsule Take 60 mg by mouth daily. cbc 09/27/20  Yes [provider]  hydrOXYzine (ATARAX) 10 MG tablet Take 1 to 2 tablets 3 times daily as needed for itching 05/03/22  Yes Shirlee Latch, PA-C  pantoprazole (PROTONIX) 20 MG tablet  01/23/22  Yes [provider]  betamethasone dipropionate (DIPROLENE) 0.05 % ointment Apply topically 2 (two) times daily. Patient not taking: Reported on 08/06/2023 05/03/22   Eusebio Friendly B, PA-C    Allergies as of 07/03/2023 - Review Complete 04/30/2023  Allergen Reaction Noted   Penicillins Anaphylaxis 01/24/2015    Family History  Problem Relation Age of Onset   Breast cancer Mother 54   Breast cancer  Cousin 33       mat cousin    Social History   Socioeconomic History   Marital status: Married    Spouse name: Not on file   Number of children: Not on file   Years of education: Not on file   Highest education level: Not on file  Occupational History   Not on file  Tobacco Use   Smoking status: Former    Current packs/day: 0.00    Average packs/day: 1 pack/day for 34.0 years (34.0 ttl pk-yrs)    Types: Cigarettes    Start date: 12/20/1983    Quit date: 12/19/2017    Years since quitting: 5.6   Smokeless tobacco: Never  Vaping Use   Vaping status: Never Used  Substance and Sexual Activity   Alcohol use: No    Alcohol/week: 0.0 standard drinks of alcohol   Drug use: No   Sexual activity: Never  Other Topics Concern   Not on file  Social History Narrative   Not on file   Social Drivers of Health   Financial Resource Strain: Not on file  Food Insecurity: Not on file  Transportation Needs: Not on file  Physical Activity: Not on file  Stress: Not on file  Social Connections: Not on file  Intimate Partner Violence: Not on file    Review of Systems: See HPI, otherwise negative ROS  Physical Exam: BP 124/69   Pulse (!) 17   Temp 98.2 F (36.8 C) (Temporal)  Resp 17   Ht 5\' 4"  (1.626 m)   Wt 68 kg   SpO2 100%   BMI 25.75 kg/m  General:   Alert and cooperative in NAD Head:  Normocephalic and atraumatic. Respiratory:  Normal work of breathing.  Impression/Plan: Kathryn Willis is here for periocular surgery.  Risks, benefits, limitations, and alternatives regarding surgery have been reviewed with the patient.  Questions have been answered.  All parties agreeable.   Imagene Riches, MD  08/16/2023, 11:17 AM

## 2023-08-19 ENCOUNTER — Encounter: Payer: Self-pay | Admitting: Ophthalmology

## 2023-08-19 NOTE — Anesthesia Postprocedure Evaluation (Signed)
Anesthesia Post Note  Patient: Kathryn Willis  Procedure(s) Performed: BLEPHAROPLASTY UPPER EYELID; W/EXCESS SKIN BILATERAL (Bilateral)  Patient location during evaluation: PACU Anesthesia Type: General Level of consciousness: awake and alert Pain management: pain level controlled Vital Signs Assessment: post-procedure vital signs reviewed and stable Respiratory status: spontaneous breathing, nonlabored ventilation, respiratory function stable and patient connected to nasal cannula oxygen Cardiovascular status: blood pressure returned to baseline and stable Postop Assessment: no apparent nausea or vomiting Anesthetic complications: no   No notable events documented.   Last Vitals:  Vitals:   08/16/23 1225 08/16/23 1228  BP: (!) 107/56 117/65  Pulse: (!) 57 (!) 58  Resp: 12 14  Temp:    SpO2: 95% 95%    Last Pain:  Vitals:   08/16/23 1228  TempSrc:   PainSc: 0-No pain                 Yevette Edwards

## 2023-09-03 ENCOUNTER — Encounter: Payer: Self-pay | Admitting: Acute Care

## 2023-12-31 ENCOUNTER — Ambulatory Visit: Admission: EM | Admit: 2023-12-31 | Discharge: 2023-12-31 | Disposition: A | Discharge status: Home / Self Care

## 2023-12-31 DIAGNOSIS — L299 Pruritus, unspecified: Secondary | ICD-10-CM

## 2023-12-31 DIAGNOSIS — S90562A Insect bite (nonvenomous), left ankle, initial encounter: Secondary | ICD-10-CM | POA: Diagnosis not present

## 2023-12-31 DIAGNOSIS — W57XXXA Bitten or stung by nonvenomous insect and other nonvenomous arthropods, initial encounter: Secondary | ICD-10-CM

## 2023-12-31 DIAGNOSIS — R21 Rash and other nonspecific skin eruption: Secondary | ICD-10-CM

## 2023-12-31 MED ORDER — HYDROXYZINE HCL 25 MG PO TABS: 25.0000 mg | ORAL_TABLET | Freq: Four times a day (QID) | ORAL | 0 refills | Status: AC | PRN

## 2023-12-31 MED ORDER — TRIAMCINOLONE ACETONIDE 0.5 % EX OINT: 1.0000 | TOPICAL_OINTMENT | Freq: Two times a day (BID) | CUTANEOUS | 0 refills | Status: AC

## 2023-12-31 NOTE — ED Triage Notes (Signed)
 Pt c/o spider bite in L ankle x2 days. Area red,itchy & swollen. Has tried OTC creams w/o relief.

## 2023-12-31 NOTE — ED Provider Notes (Signed)
 [facility]    CSN: [id number] Arrival date & time: 12/31/23  1415      History   Chief Complaint Chief Complaint  Patient presents with   Insect Bite    HPI Kathryn Willis is a 68 y.o. female presenting for rash of the right ankle x 2 days. Patient reports she was recently outside and thinks she came into contact with fire ants. She has been applying ice which does help with swelling and also taking Benadryl at nighttime and using topical triple antibiotic ointments.  She says the itching and benadryl is not really helping that. She says she does not know what else to do but she is very uncomfortable. She does note significant improvement in swelling and redness.  HPI  Past Medical History:  Diagnosis Date   Arthritis    finger   Depression    Emphysema of lung (HCC)     Patient Active Problem List   Diagnosis Date Noted   Acute pharyngitis 04/30/2023   Gastroesophageal reflux disease 04/30/2023   Family history of colon cancer    Polyp of ascending colon     Past Surgical History:  Procedure Laterality Date   BROW LIFT Bilateral 08/16/2023   Procedure: BLEPHAROPLASTY UPPER EYELID; W/EXCESS SKIN BILATERAL;  Surgeon: Zacarias Hermann, MD;  Location: [facility];  Service: Ophthalmology;  Laterality: Bilateral;   COLONOSCOPY WITH PROPOFOL  N/A 02/16/2019   Procedure: COLONOSCOPY WITH BIOPSY;  Surgeon: Marnee Sink, MD;  Location: [facility];  Service: Endoscopy;  Laterality: N/A;   MOUTH SURGERY  12/19/2017   Bone graft   POLYPECTOMY N/A 02/16/2019   Procedure: POLYPECTOMY;  Surgeon: Marnee Sink, MD;  Location: [facility];  Service: Endoscopy;  Laterality: N/A;    OB History   No obstetric history on file.      Home Medications    Prior to Admission medications   Medication Sig Start Date End Date Taking? Authorizing Provider  atorvastatin (LIPITOR) 40 MG tablet TAKE ONE-HALF TABLET BY MOUTH AT BEDTIME FOR HIGH CHOLESTEROL  09/24/21  Yes [provider]  calcium-vitamin D (OSCAL WITH D) 500-5 MG-MCG tablet Take 1 tablet by mouth.   Yes [provider]  erythromycin  ophthalmic ointment Apply to sutures 4 times a day for 10-12 days.  Discontinue if allergy develops and call our office 08/16/23  Yes Zacarias Hermann, MD  FLUoxetine (PROZAC) 20 MG capsule Take 60 mg by mouth daily. cbc 09/27/20  Yes [provider]  hydrOXYzine  (ATARAX ) 25 MG tablet Take 1 tablet (25 mg total) by mouth every 6 (six) hours as needed. 12/31/23  Yes Nancy Axon B, PA-C  omeprazole (PRILOSEC) 40 MG capsule TAKE ONE CAPSULE BY MOUTH EVERY DAY FOR HEARTBURN TAKE 30 MINUTES BEFORE A MEAL 09/06/23  Yes [provider]  pantoprazole (PROTONIX) 20 MG tablet  01/23/22  Yes [provider]  traMADol  (ULTRAM ) 50 MG tablet Take 1 every 4-6 hours as needed for pain not controlled by Tylenol 08/16/23  Yes Zacarias Hermann, MD  triamcinolone ointment (KENALOG) 0.5 % Apply 1 Application topically 2 (two) times daily. 12/31/23  Yes Floydene Hy, PA-C  famotidine (PEPCID) 20 MG tablet Take 20 mg by mouth 2 (two) times daily.    [provider]    Family History Family History  Problem Relation Age of Onset   Breast cancer Mother 32   Breast cancer Cousin 85       mat cousin  Social History Social History   Tobacco Use   Smoking status: Former    Current packs/day: 0.00    Average packs/day: 1 pack/day for 34.0 years (34.0 ttl pk-yrs)    Types: Cigarettes    Start date: 12/20/1983    Quit date: 12/19/2017    Years since quitting: 6.0   Smokeless tobacco: Never  Vaping Use   Vaping status: Never Used  Substance Use Topics   Alcohol use: No    Alcohol/week: 0.0 standard drinks of alcohol   Drug use: No     Allergies   Penicillins   Review of Systems Review of Systems  Constitutional:  Negative for fever.  Musculoskeletal:  Positive for joint swelling. Negative for arthralgias.  Skin:   Positive for rash. Negative for wound.     Physical Exam Triage Vital Signs ED Triage Vitals  Enc Vitals Group     BP 05/03/22 1150 137/78     Pulse Rate 05/03/22 1150 81     Resp 05/03/22 1150 18     Temp 05/03/22 1150 98.9 F (37.2 C)     Temp Source 05/03/22 1150 Oral     SpO2 05/03/22 1150 94 %     Weight 05/03/22 1148 147 lb (66.7 kg)     Height 05/03/22 1148 5\' 4"  (1.626 m)     Head Circumference --      Peak Flow --      Pain Score 05/03/22 1148 10     Pain Loc --      Pain Edu? --      Excl. in GC? --    No data found.  Updated Vital Signs BP 126/73 (BP Location: Left Arm)   Pulse 70   Temp 98.4 F (36.9 C) (Oral)   Resp 16   Ht 5\' 4"  (1.626 m)   Wt 150 lb (68 kg)   SpO2 96%   BMI 25.75 kg/m     Physical Exam Vitals and nursing note reviewed.  Constitutional:      General: She is not in acute distress.    Appearance: Normal appearance. She is not ill-appearing or toxic-appearing.  HENT:     Head: Normocephalic and atraumatic.  Eyes:     General: No scleral icterus.       Right eye: No discharge.        Left eye: No discharge.     Conjunctiva/sclera: Conjunctivae normal.  Cardiovascular:     Rate and Rhythm: Normal rate.  Pulmonary:     Effort: Pulmonary effort is normal. No respiratory distress.  Musculoskeletal:     Cervical back: Neck supple.  Skin:    General: Skin is dry.     Findings: Rash present.     Comments: 1 pustule and a couple of papules with slight redness left ankle. See image  Neurological:     General: No focal deficit present.     Mental Status: She is alert. Mental status is at baseline.     Motor: No weakness.     Gait: Gait normal.  Psychiatric:        Mood and Affect: Mood normal.        Behavior: Behavior normal.      UC Treatments / Results  Labs (all labs ordered are listed, but only abnormal results are displayed) Labs Reviewed - No data to display  EKG   Radiology No results  found.  Procedures Procedures (including critical care time)  Medications Ordered in UC Medications -  No data to display   Initial Impression / Assessment and Plan / UC Course  I have reviewed the triage vital signs and the nursing notes.  Pertinent labs & imaging results that were available during my care of the patient were reviewed by me and considered in my medical decision making (see chart for details).   68 year old female presenting for fire ant bites to left ankle that occurred a couple days ago.  She reports have improved with neosporin, ice and Benadryl. Also elevating leg. Most bothered by itching. On exam she does have  erythematous papules of left ankle. See image. We discussed treating with topical triamcinolone and oral hydroxyzine . Continue ice and elevation.  Reviewed return precautions.   Final Clinical Impressions(s) / UC Diagnoses   Final diagnoses:  Rash  Insect bite of left ankle, initial encounter  Pruritus   Discharge Instructions   None     ED Prescriptions     Medication Sig Dispense Auth. Provider   triamcinolone ointment (KENALOG) 0.5 % Apply 1 Application topically 2 (two) times daily. 30 g Nancy Axon B, PA-C   hydrOXYzine  (ATARAX ) 25 MG tablet Take 1 tablet (25 mg total) by mouth every 6 (six) hours as needed. 30 tablet Valeen Borys B, PA-C      PDMP not reviewed this encounter.       Floydene Hy, PA-C 12/31/23 1505

## 2024-01-16 ENCOUNTER — Other Ambulatory Visit: Payer: Self-pay | Admitting: *Deleted

## 2024-01-16 ENCOUNTER — Telehealth: Payer: Self-pay | Admitting: *Deleted

## 2024-01-16 DIAGNOSIS — Z860100 Personal history of colon polyps, unspecified: Secondary | ICD-10-CM

## 2024-01-16 DIAGNOSIS — Z800 Family history of malignant neoplasm of digestive organs: Secondary | ICD-10-CM

## 2024-01-16 NOTE — Telephone Encounter (Signed)
 Gastroenterology Pre-Procedure Review  Request Date: 02/24/2024 Requesting Physician: Dr. Ole Berkeley  PATIENT REVIEW QUESTIONS: The patient responded to the following health history questions as indicated:    1. Are you having any GI issues? no 2. Do you have a personal history of Polyps? yes (last colonoscopy with Dr Ole Berkeley on 02/16/2024) 3. Do you have a family history of Colon Cancer or Polyps? yes (sister had colon cancer) 4. Diabetes Mellitus? no 5. Joint replacements in the past 12 months?no 6. Major health problems in the past 3 months?no 7. Any artificial heart valves, MVP, or defibrillator?no    MEDICATIONS & ALLERGIES:    Patient reports the following regarding taking any anticoagulation/antiplatelet therapy:   Plavix, Coumadin, Eliquis, Xarelto, Lovenox, Pradaxa, Brilinta, or Effient? no Aspirin? no  Patient confirms/reports the following medications:  Current Outpatient Medications  Medication Sig Dispense Refill   atorvastatin (LIPITOR) 40 MG tablet TAKE ONE-HALF TABLET BY MOUTH AT BEDTIME FOR HIGH CHOLESTEROL     calcium-vitamin D (OSCAL WITH D) 500-5 MG-MCG tablet Take 1 tablet by mouth.     erythromycin  ophthalmic ointment Apply to sutures 4 times a day for 10-12 days.  Discontinue if allergy develops and call our office 3.5 g 2   famotidine (PEPCID) 20 MG tablet Take 20 mg by mouth 2 (two) times daily.     FLUoxetine (PROZAC) 20 MG capsule Take 60 mg by mouth daily. cbc     hydrOXYzine  (ATARAX ) 25 MG tablet Take 1 tablet (25 mg total) by mouth every 6 (six) hours as needed. 30 tablet 0   omeprazole (PRILOSEC) 40 MG capsule TAKE ONE CAPSULE BY MOUTH EVERY DAY FOR HEARTBURN TAKE 30 MINUTES BEFORE A MEAL     pantoprazole (PROTONIX) 20 MG tablet      traMADol  (ULTRAM ) 50 MG tablet Take 1 every 4-6 hours as needed for pain not controlled by Tylenol 6 tablet 0   triamcinolone  ointment (KENALOG ) 0.5 % Apply 1 Application topically 2 (two) times daily. 30 g 0   No current  facility-administered medications for this visit.    Patient confirms/reports the following allergies:  Allergies  Allergen Reactions   Penicillins Anaphylaxis    No orders of the defined types were placed in this encounter.   AUTHORIZATION INFORMATION Primary Insurance: 1D#: Group #:  Secondary Insurance: 1D#: Group #:  SCHEDULE INFORMATION: Date: 02/24/2024 Time: Location: [facility]

## 2024-01-22 ENCOUNTER — Telehealth: Payer: Self-pay

## 2024-01-22 MED ORDER — NA SULFATE-K SULFATE-MG SULF 17.5-3.13-1.6 GM/177ML PO SOLN: 1.0000 | Freq: Once | ORAL | 0 refills | Status: AC

## 2024-01-22 NOTE — Telephone Encounter (Signed)
 Suprep have been sent to [facility] in [facility] as requested.

## 2024-01-22 NOTE — Addendum Note (Signed)
 Addended by: Jalayiah Bibian on: 01/22/2024 03:48 PM   Modules accepted: Orders

## 2024-01-22 NOTE — Telephone Encounter (Signed)
 [facility] approval [id number] 01-22-2024 until 04-21-2024

## 2024-01-29 ENCOUNTER — Telehealth: Payer: Self-pay

## 2024-01-29 MED ORDER — PEG 3350-KCL-NA BICARB-NACL 420 G PO SOLR: 4000.0000 mL | Freq: Once | ORAL | 0 refills | Status: AC

## 2024-01-29 MED ORDER — NA SULFATE-K SULFATE-MG SULF 17.5-3.13-1.6 GM/177ML PO SOLN: 1.0000 | Freq: Once | ORAL | 0 refills | Status: DC

## 2024-01-29 NOTE — Addendum Note (Signed)
 Addended by: CURTISS ROSALINE RAMAN on: 01/29/2024 11:23 AM   Modules accepted: Orders

## 2024-01-29 NOTE — Telephone Encounter (Signed)
 [facility] requested an alternative prep because Suprep is not covered.  Requested to change to Golytely or Nulytely.   Prep changed to Nulytely.  Mychart message sent to patient to make her aware and provide instructions.  Thanks,  [place], CMA

## 2024-01-29 NOTE — Telephone Encounter (Signed)
 Pt requested Suprep rx to be sent to [facility].  New rx has been sent.    Previously sent rx for Suprep has [facility] [place]. Message has been left on voice mail to cancel out the prescription.  Thanks,  [place], CMA

## 2024-02-14 ENCOUNTER — Telehealth: Payer: Self-pay

## 2024-02-14 NOTE — Telephone Encounter (Signed)
 Pt was contacted to reschedule her 02/24/24 colonoscopy due to Dr Felicitas schedule was overfilled.  Pt has been rescheduled to 03/16/24.  She requested a callback if we happen to have any cancellations sooner than the 03/16/24 date.  Trish in Endo notified of date change.  Informed her that I will call her if we have cancellation.  Referral updated.  Instructions updated.  Thanks,  [place], CMA

## 2024-02-24 NOTE — Telephone Encounter (Signed)
 We had a cancellation for 03/05/24 with Dr. Jinny.  Pt has been contacted to see if she would like to move up her procedure date.  She has accepted the procedure date for 03/05/24.  Trish in Endo notified of procedure reschedule to 03/05/24.  Instructions updated and referral updated.  Thanks,  [place], CMA

## 2024-03-05 ENCOUNTER — Ambulatory Visit
Admission: RE | Admit: 2024-03-05 | Discharge: 2024-03-05 | Disposition: A | Discharge status: Home / Self Care | Attending: Gastroenterology | Admitting: Gastroenterology

## 2024-03-05 ENCOUNTER — Ambulatory Visit: Admitting: Certified Registered"

## 2024-03-05 ENCOUNTER — Encounter
Admission: RE | Disposition: A | Discharge status: Home / Self Care | Payer: Self-pay | Source: Home / Self Care | Attending: Gastroenterology

## 2024-03-05 ENCOUNTER — Encounter: Payer: Self-pay | Admitting: Gastroenterology

## 2024-03-05 DIAGNOSIS — K621 Rectal polyp: Secondary | ICD-10-CM

## 2024-03-05 DIAGNOSIS — J449 Chronic obstructive pulmonary disease, unspecified: Secondary | ICD-10-CM | POA: Diagnosis not present

## 2024-03-05 DIAGNOSIS — K219 Gastro-esophageal reflux disease without esophagitis: Secondary | ICD-10-CM | POA: Insufficient documentation

## 2024-03-05 DIAGNOSIS — D123 Benign neoplasm of transverse colon: Secondary | ICD-10-CM | POA: Diagnosis not present

## 2024-03-05 DIAGNOSIS — F32A Depression, unspecified: Secondary | ICD-10-CM | POA: Insufficient documentation

## 2024-03-05 DIAGNOSIS — Z87891 Personal history of nicotine dependence: Secondary | ICD-10-CM | POA: Insufficient documentation

## 2024-03-05 DIAGNOSIS — Z1211 Encounter for screening for malignant neoplasm of colon: Secondary | ICD-10-CM

## 2024-03-05 DIAGNOSIS — Z860101 Personal history of adenomatous and serrated colon polyps: Secondary | ICD-10-CM

## 2024-03-05 DIAGNOSIS — M1990 Unspecified osteoarthritis, unspecified site: Secondary | ICD-10-CM | POA: Diagnosis not present

## 2024-03-05 DIAGNOSIS — Z860100 Personal history of colon polyps, unspecified: Secondary | ICD-10-CM

## 2024-03-05 DIAGNOSIS — J439 Emphysema, unspecified: Secondary | ICD-10-CM | POA: Diagnosis not present

## 2024-03-05 DIAGNOSIS — Z79899 Other long term (current) drug therapy: Secondary | ICD-10-CM | POA: Insufficient documentation

## 2024-03-05 DIAGNOSIS — K635 Polyp of colon: Secondary | ICD-10-CM

## 2024-03-05 HISTORY — PX: POLYPECTOMY: SHX149

## 2024-03-05 HISTORY — PX: COLONOSCOPY: SHX5424

## 2024-03-05 SURGERY — COLONOSCOPY
Anesthesia: General

## 2024-03-05 MED ORDER — SODIUM CHLORIDE 0.9 % IV SOLN: INTRAVENOUS | Status: DC

## 2024-03-05 MED ORDER — PROPOFOL 500 MG/50ML IV EMUL
INTRAVENOUS | Status: DC | PRN
  Administered 2024-03-05: 50 mg via INTRAVENOUS
  Administered 2024-03-05: 150 ug/kg/min via INTRAVENOUS

## 2024-03-05 MED ORDER — LIDOCAINE HCL (CARDIAC) PF 100 MG/5ML IV SOSY
PREFILLED_SYRINGE | INTRAVENOUS | Status: DC | PRN
  Administered 2024-03-05: 100 mg via INTRAVENOUS

## 2024-03-05 NOTE — Op Note (Signed)
 [facility] Gastroenterology Patient Name: Kathryn Willis Procedure Date: 03/05/2024 8:42 AM MRN: [id number] Account #: [id number] Date of Birth: Oct 10, 1955 Admit Type: Outpatient Age: 68 Room: [facility] ENDO ROOM 4 Gender: Female Note Status: Finalized Instrument Name: Colon Scope [id number] Procedure:             Colonoscopy Indications:           High risk colon cancer surveillance: Personal history                         of colonic polyps Providers:             Rogelia Copping MD, MD Referring MD:          [facility] Medicines:             Propofol  per Anesthesia Complications:         No immediate complications. Procedure:             Pre-Anesthesia Assessment:                        - Prior to the procedure, a History and Physical was                         performed, and patient medications and allergies were                         reviewed. The patient's tolerance of previous                         anesthesia was also reviewed. The risks and benefits                         of the procedure and the sedation options and risks                         were discussed with the patient. All questions were                         answered, and informed consent was obtained. Prior                         Anticoagulants: The patient has taken no anticoagulant                         or antiplatelet agents. ASA Grade Assessment: II - A                         patient with mild systemic disease. After reviewing                         the risks and benefits, the patient was deemed in                         satisfactory condition to undergo the procedure.                        After obtaining informed consent, the colonoscope was  passed under direct vision. Throughout the procedure,                         the patient's blood pressure, pulse, and oxygen                         saturations were monitored continuously. The                          Colonoscope was introduced through the anus and                         advanced to the the cecum, identified by appendiceal                         orifice and ileocecal valve. The colonoscopy was                         performed without difficulty. The patient tolerated                         the procedure well. The quality of the bowel                         preparation was excellent. Findings:      The perianal and digital rectal examinations were normal.      Two sessile polyps were found in the transverse colon. The polyps were 3       to 9 mm in size. These polyps were removed with a cold snare. Resection       and retrieval were complete.      Two sessile polyps were found in the rectum. The polyps were 1 to 2 mm       in size. These polyps were removed with a cold snare. Resection and       retrieval were complete. Impression:            - Two 3 to 9 mm polyps in the transverse colon,                         removed with a cold snare. Resected and retrieved.                        - Two 1 to 2 mm polyps in the rectum, removed with a                         cold snare. Resected and retrieved. Recommendation:        - Discharge patient to home.                        - Resume previous diet.                        - Continue present medications.                        - Await pathology results.                        - Repeat colonoscopy  in 5 years for surveillance. Procedure Code(s):     --- Professional ---                        [phone], Colonoscopy, flexible; with removal of                         tumor(s), polyp(s), or other lesion(s) by snare                         technique Diagnosis Code(s):     --- Professional ---                        Z86.010, Personal history of colonic polyps                        D12.8, Benign neoplasm of rectum CPT copyright 2022 [employer]. All rights reserved. The codes documented in this report are preliminary and upon  coder review may  be revised to meet current compliance requirements. Rogelia Copping MD, MD 03/05/2024 9:07:01 AM This report has been signed electronically. Number of Addenda: 0 Note Initiated On: 03/05/2024 8:42 AM Scope Withdrawal Time: 0 hours 9 minutes 40 seconds  Total Procedure Duration: 0 hours 14 minutes 51 seconds  Estimated Blood Loss:  Estimated blood loss: none.      [facility]

## 2024-03-05 NOTE — Transfer of Care (Signed)
 Immediate Anesthesia Transfer of Care Note  Patient: Kathryn Willis  Procedure(s) Performed: COLONOSCOPY POLYPECTOMY, INTESTINE  Patient Location: PACU  Anesthesia Type:General  Level of Consciousness: drowsy and patient cooperative  Airway & Oxygen Therapy: Patient Spontanous Breathing  Post-op Assessment: Report given to RN and Post -op Vital signs reviewed and stable  Post vital signs: stable  Last Vitals:  Vitals Value Taken Time  BP    Temp    Pulse    Resp    SpO2      Last Pain:  Vitals:   03/05/24 0828  TempSrc: Temporal         Complications: No notable events documented.

## 2024-03-05 NOTE — Anesthesia Preprocedure Evaluation (Signed)
 Anesthesia Evaluation  Patient identified by MRN, date of birth, ID band Patient awake    Reviewed: Allergy & Precautions, NPO status , Patient's Chart, lab work & pertinent test results  Airway Mallampati: II  TM Distance: >3 FB Neck ROM: full    Dental  (+) Teeth Intact   Pulmonary neg pulmonary ROS, COPD, Patient abstained from smoking., former smoker   Pulmonary exam normal breath sounds clear to auscultation       Cardiovascular Exercise Tolerance: Good negative cardio ROS Normal cardiovascular exam Rhythm:Regular Rate:Normal     Neuro/Psych    Depression    negative neurological ROS  negative psych ROS   GI/Hepatic negative GI ROS, Neg liver ROS,GERD  Medicated,,  Endo/Other  negative endocrine ROS    Renal/GU negative Renal ROS  negative genitourinary   Musculoskeletal  (+) Arthritis ,    Abdominal Normal abdominal exam  (+)   Peds negative pediatric ROS (+)  Hematology negative hematology ROS (+)   Anesthesia Other Findings Past Medical History: No date: Arthritis     Comment:  finger No date: Depression No date: Emphysema of lung (HCC)  Past Surgical History: 08/16/2023: BROW LIFT; Bilateral     Comment:  Procedure: BLEPHAROPLASTY UPPER EYELID; W/EXCESS SKIN               BILATERAL;  Surgeon: Ashley Greig HERO, MD;  Location: [facility]              SURGERY CN[facility];  Service: Ophthalmology;  Laterality:               Bilateral; 02/16/2019: COLONOSCOPY WITH PROPOFOL ; N/A     Comment:  Procedure: COLONOSCOPY WITH BIOPSY;  Surgeon: Jinny Carmine, MD;  Location: [facility];  Service:               Endoscopy;  Laterality: N/A; 12/19/2017: MOUTH SURGERY     Comment:  Bone graft 02/16/2019: POLYPECTOMY; N/A     Comment:  Procedure: POLYPECTOMY;  Surgeon: Jinny Carmine, MD;                Location: [facility];  Service: Endoscopy;                Laterality: N/A;      Reproductive/Obstetrics negative OB ROS                              Anesthesia Physical Anesthesia Plan  ASA: 2  Anesthesia Plan: General   Post-op Pain Management:    Induction: Intravenous  PONV Risk Score and Plan: Propofol  infusion and TIVA  Airway Management Planned: Natural Airway and Nasal Cannula  Additional Equipment:   Intra-op Plan:   Post-operative Plan:   Informed Consent: I have reviewed the patients History and Physical, chart, labs and discussed the procedure including the risks, benefits and alternatives for the proposed anesthesia with the patient or authorized representative who has indicated his/her understanding and acceptance.     Dental Advisory Given  Plan Discussed with: CRNA  Anesthesia Plan Comments:         Anesthesia Quick Evaluation

## 2024-03-05 NOTE — H&P (Signed)
 Kathryn Copping, MD [facility] [address], [address] [place], [place] [address] Phone:[phone] Fax : [phone]  Primary Care Physician:  [facility] Primary Gastroenterologist:  Dr. Copping  Pre-Procedure History & Physical: HPI:  Kathryn Willis is a 68 y.o. female is here for an colonoscopy.   Past Medical History:  Diagnosis Date   Arthritis    finger   Depression    Emphysema of lung (HCC)     Past Surgical History:  Procedure Laterality Date   BROW LIFT Bilateral 08/16/2023   Procedure: BLEPHAROPLASTY UPPER EYELID; W/EXCESS SKIN BILATERAL;  Surgeon: Ashley Greig HERO, MD;  Location: [facility];  Service: Ophthalmology;  Laterality: Bilateral;   COLONOSCOPY WITH PROPOFOL  N/A 02/16/2019   Procedure: COLONOSCOPY WITH BIOPSY;  Surgeon: Willis Rogelia, MD;  Location: [facility] SUR[facility];  Service: Endoscopy;  Laterality: N/A;   MOUTH SURGERY  12/19/2017   Bone graft   POLYPECTOMY N/A 02/16/2019   Procedure: POLYPECTOMY;  Surgeon: Willis Rogelia, MD;  Location: [facility];  Service: Endoscopy;  Laterality: N/A;    Prior to Admission medications   Medication Sig Start Date End Date Taking? Authorizing Provider  atorvastatin (LIPITOR) 40 MG tablet TAKE ONE-HALF TABLET BY MOUTH AT BEDTIME FOR HIGH CHOLESTEROL 09/24/21  Yes [provider]  calcium-vitamin D (OSCAL WITH D) 500-5 MG-MCG tablet Take 1 tablet by mouth.   Yes [provider]  FLUoxetine (PROZAC) 20 MG capsule Take 60 mg by mouth daily. cbc 09/27/20  Yes [provider]  omeprazole (PRILOSEC) 40 MG capsule TAKE ONE CAPSULE BY MOUTH EVERY DAY FOR HEARTBURN TAKE 30 MINUTES BEFORE A MEAL 09/06/23  Yes [provider]  erythromycin  ophthalmic ointment Apply to sutures 4 times a day for 10-12 days.  Discontinue if allergy develops and call our office 08/16/23   Ashley Greig HERO, MD  famotidine (PEPCID) 20 MG tablet Take 20 mg by mouth 2 (two) times daily.    [provider]  hydrOXYzine  (ATARAX ) 25 MG tablet Take 1 tablet (25 mg total) by mouth every 6 (six) hours as needed. 12/31/23   Arvis Jolan NOVAK, PA-C  pantoprazole (PROTONIX) 20 MG tablet  01/23/22   [provider]  traMADol  (ULTRAM ) 50 MG tablet Take 1 every 4-6 hours as needed for pain not controlled by Tylenol 08/16/23   Ashley Greig HERO, MD  triamcinolone  ointment (KENALOG ) 0.5 % Apply 1 Application topically 2 (two) times daily. 12/31/23   Arvis Jolan B, PA-C    Allergies as of 01/16/2024 - Review Complete 12/31/2023  Allergen Reaction Noted   Penicillins Anaphylaxis 01/24/2015    Family History  Problem Relation Age of Onset   Breast cancer Mother 70   Breast cancer Cousin 38       mat cousin    Social History   Socioeconomic History   Marital status: Married    Spouse name: Not on file   Number of children: Not on file   Years of education: Not on file   Highest education level: Not on file  Occupational History   Not on file  Tobacco Use   Smoking status: Former    Current packs/day: 0.00    Average packs/day: 1 pack/day for 34.0 years (34.0 ttl pk-yrs)    Types: Cigarettes    Start date: 12/20/1983    Quit date: 12/19/2017    Years since quitting: 6.2   Smokeless tobacco: Never  Vaping Use   Vaping status: Never Used  Substance  and Sexual Activity   Alcohol use: No    Alcohol/week: 0.0 standard drinks of alcohol   Drug use: No   Sexual activity: Never  Other Topics Concern   Not on file  Social History Narrative   Not on file   Social Drivers of Health   Financial Resource Strain: Not on file  Food Insecurity: Not on file  Transportation Needs: Not on file  Physical Activity: Not on file  Stress: Not on file  Social Connections: Not on file  Intimate Partner Violence: Not on file    Review of Systems: See HPI, otherwise negative ROS  Physical Exam: BP 113/66   Pulse 83   Temp (!) 96.7 F (35.9 C) (Temporal)   Resp 18   Wt 69.5 kg    SpO2 98%   BMI 26.30 kg/m  General:   Alert,  pleasant and cooperative in NAD Head:  Normocephalic and atraumatic. Neck:  Supple; no masses or thyromegaly. Lungs:  Clear throughout to auscultation.    Heart:  Regular rate and rhythm. Abdomen:  Soft, nontender and nondistended. Normal bowel sounds, without guarding, and without rebound.   Neurologic:  Alert and  oriented x4;  grossly normal neurologically.  Impression/Plan: Kathryn Willis is here for an colonoscopy to be performed for a history of adenomatous polyps on 2020   Risks, benefits, limitations, and alternatives regarding  colonoscopy have been reviewed with the patient.  Questions have been answered.  All parties agreeable.   Kathryn Copping, MD  03/05/2024, 8:43 AM

## 2024-03-05 NOTE — Anesthesia Postprocedure Evaluation (Signed)
 Anesthesia Post Note  Patient: Kathryn Willis  Procedure(s) Performed: COLONOSCOPY POLYPECTOMY, INTESTINE  Patient location during evaluation: PACU Anesthesia Type: General Level of consciousness: awake and alert Pain management: satisfactory to patient Vital Signs Assessment: post-procedure vital signs reviewed and stable Respiratory status: spontaneous breathing Cardiovascular status: stable Anesthetic complications: no   There were no known notable events for this encounter.   Last Vitals:  Vitals:   03/05/24 0910 03/05/24 0929  BP:  (!) 116/57  Pulse:    Resp:    Temp: (!) 35.7 C   SpO2:      Last Pain:  Vitals:   03/05/24 0929  TempSrc:   PainSc: 0-No pain                 VAN STAVEREN,Jordi Kamm

## 2024-03-06 LAB — SURGICAL PATHOLOGY

## 2024-03-08 ENCOUNTER — Ambulatory Visit: Payer: Self-pay | Admitting: Gastroenterology
